# Patient Record
Sex: Female | Born: 1945 | Race: White | Hispanic: Yes | Marital: Married | Smoking: Never smoker
Health system: Southern US, Community
[De-identification: ages and names within clinical notes are randomized; demographics above are authoritative.]

## PROBLEM LIST (undated history)

## (undated) DIAGNOSIS — H269 Unspecified cataract: Secondary | ICD-10-CM

## (undated) DIAGNOSIS — A0100 Typhoid fever, unspecified: Secondary | ICD-10-CM

## (undated) HISTORY — DX: Unspecified cataract: H26.9

## (undated) HISTORY — DX: Typhoid fever, unspecified: A01.00

---

## 1999-06-09 ENCOUNTER — Other Ambulatory Visit: Admission: RE | Admit: 1999-06-09 | Discharge: 1999-06-09 | Payer: Self-pay | Admitting: Gynecology

## 2000-07-15 ENCOUNTER — Other Ambulatory Visit: Admission: RE | Admit: 2000-07-15 | Discharge: 2000-07-15 | Payer: Self-pay | Admitting: Gynecology

## 2003-05-29 ENCOUNTER — Encounter (INDEPENDENT_AMBULATORY_CARE_PROVIDER_SITE_OTHER): Payer: Self-pay | Admitting: *Deleted

## 2003-06-25 ENCOUNTER — Other Ambulatory Visit: Admission: RE | Admit: 2003-06-25 | Discharge: 2003-06-25 | Payer: Self-pay | Admitting: Gynecology

## 2003-07-31 ENCOUNTER — Encounter: Admission: RE | Admit: 2003-07-31 | Discharge: 2003-07-31 | Payer: Self-pay | Admitting: Family Medicine

## 2006-08-26 ENCOUNTER — Encounter (INDEPENDENT_AMBULATORY_CARE_PROVIDER_SITE_OTHER): Payer: Self-pay | Admitting: *Deleted

## 2008-06-19 ENCOUNTER — Ambulatory Visit: Payer: Self-pay | Admitting: Unknown Physician Specialty

## 2008-08-21 ENCOUNTER — Ambulatory Visit: Payer: Self-pay

## 2008-09-23 ENCOUNTER — Ambulatory Visit: Payer: Self-pay

## 2009-12-06 IMAGING — US US PELV - US TRANSVAGINAL
1 series · 17 of 25 positions shown · non-contrast
Comparison: none

REASON FOR EXAM: pelvic pain  PT needs spanish interpreter
COMMENTS:

[Series 1: us pelv - us transvaginal · 17 of 31 slices shown]
[im 1/31]
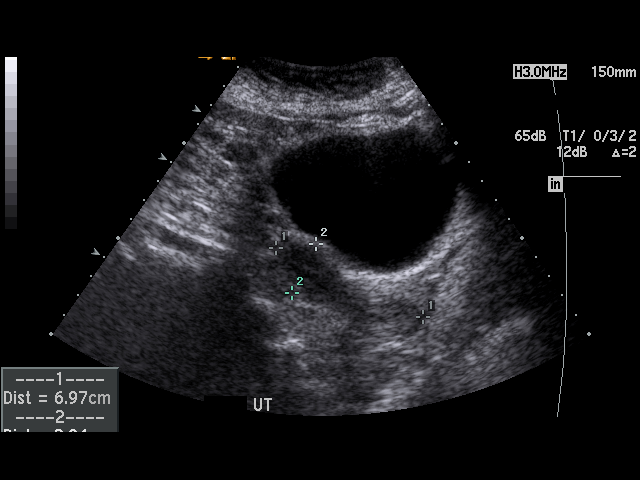
[im 3/31]
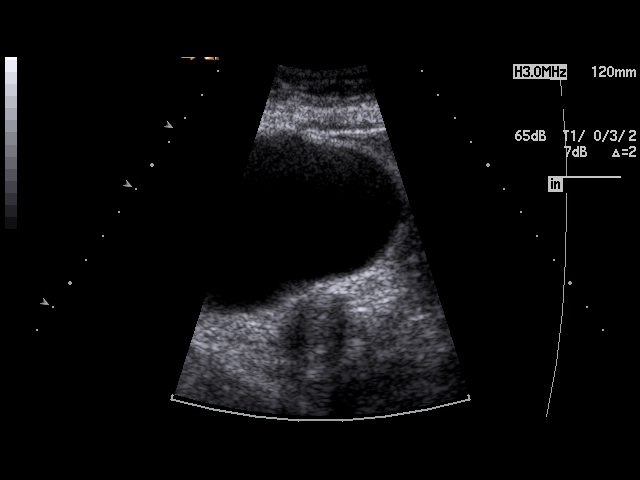
[im 4/31]
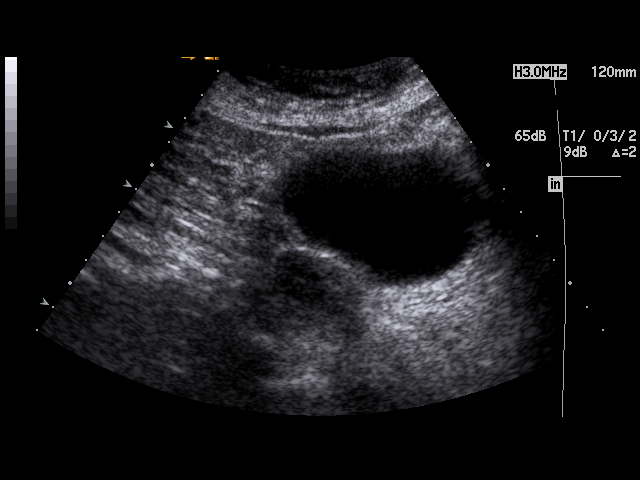
[im 7/31]
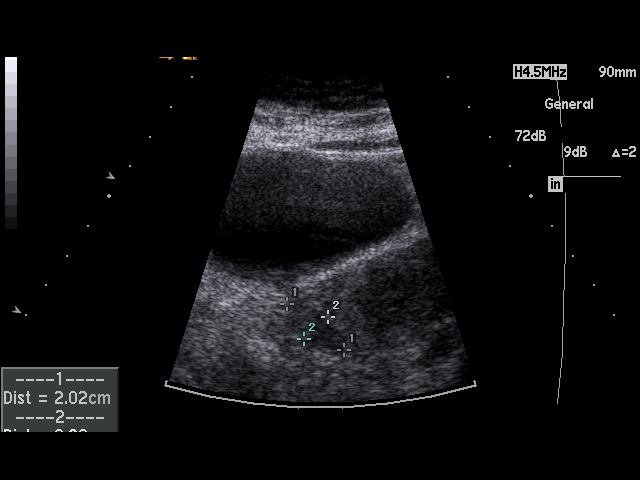
[im 8/31]
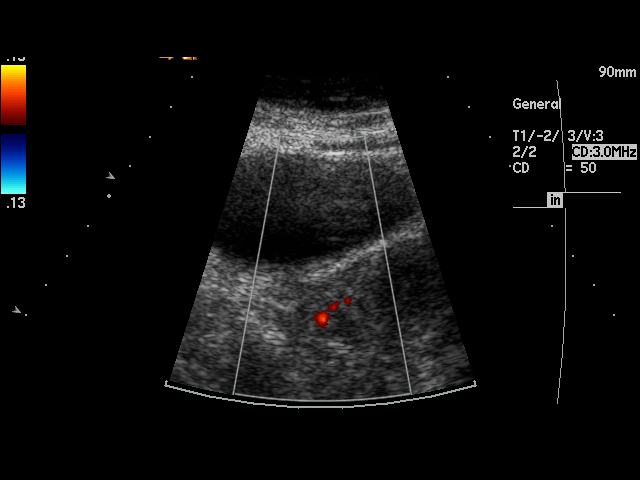
[im 11/31]
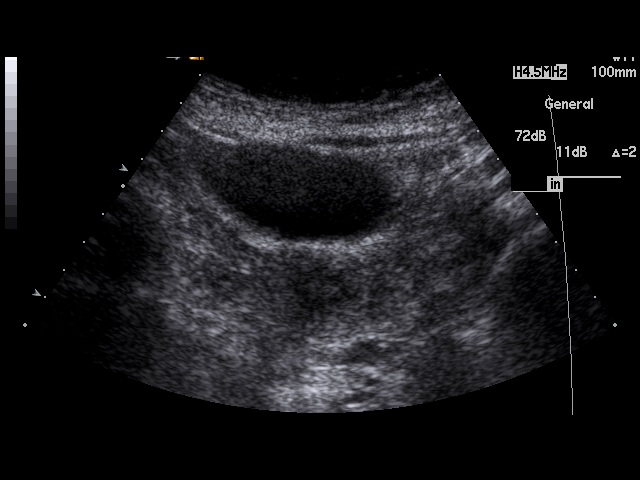
[im 12/31]
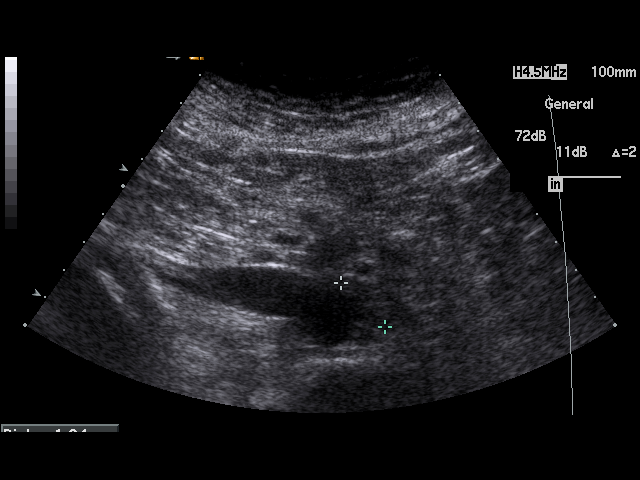
[im 14/31]
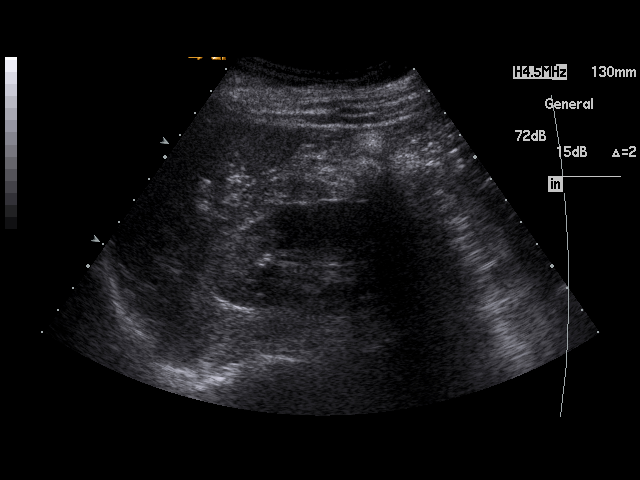
[im 16/31]
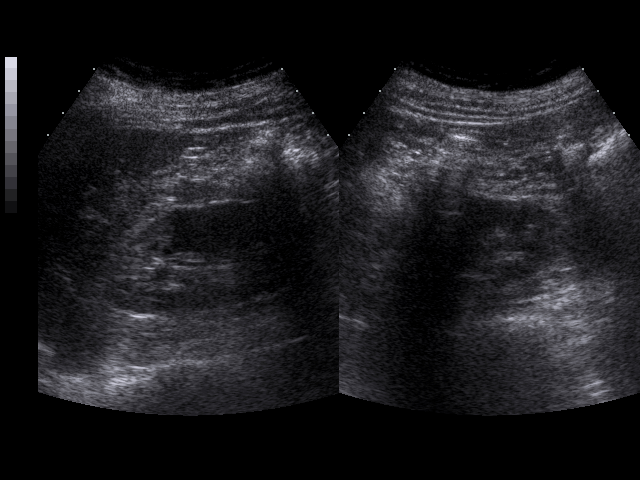
[im 17/31]
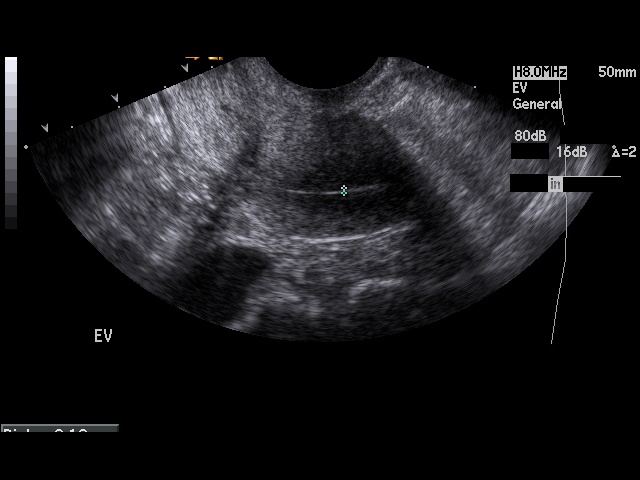
[im 19/31]
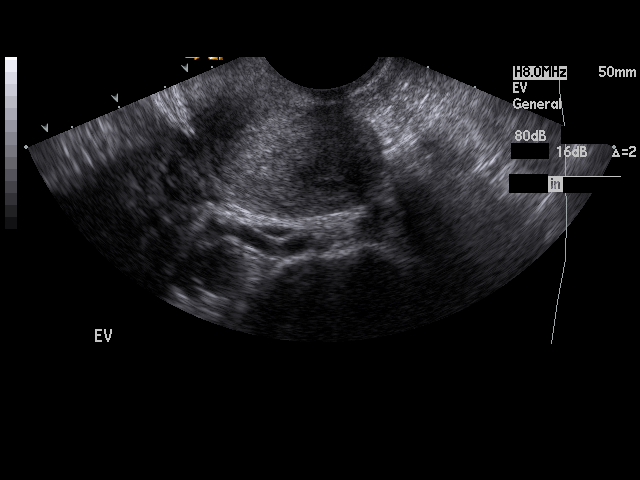
[im 21/31]
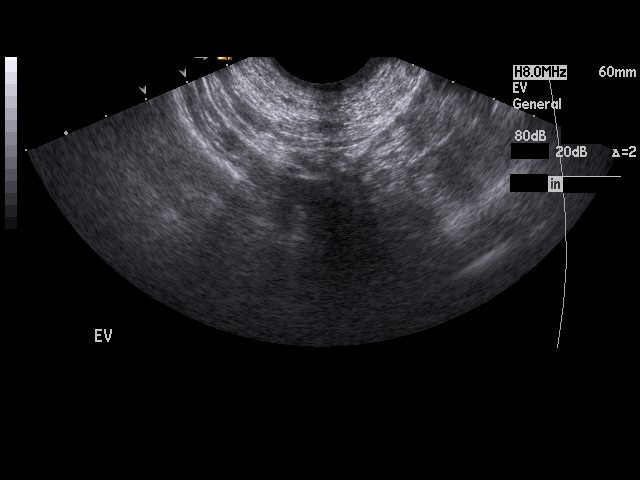
[im 23/31]
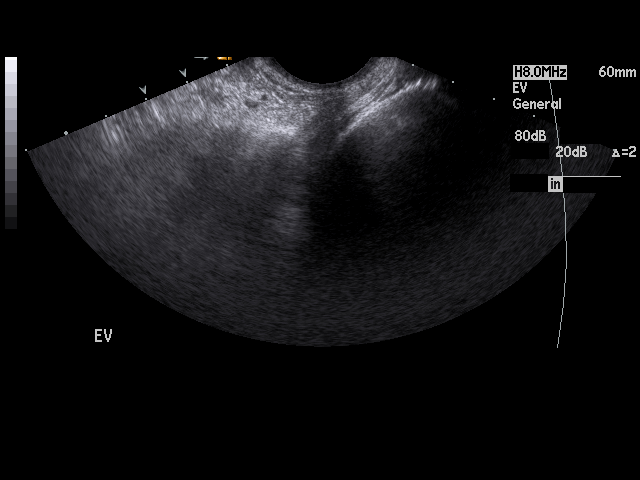
[im 24/31]
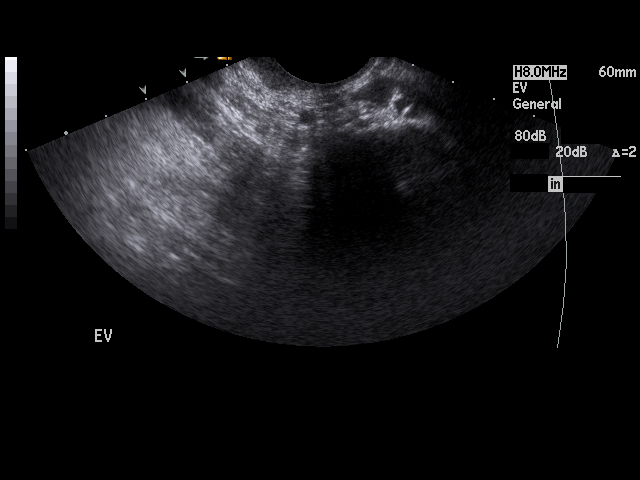
[im 27/31]
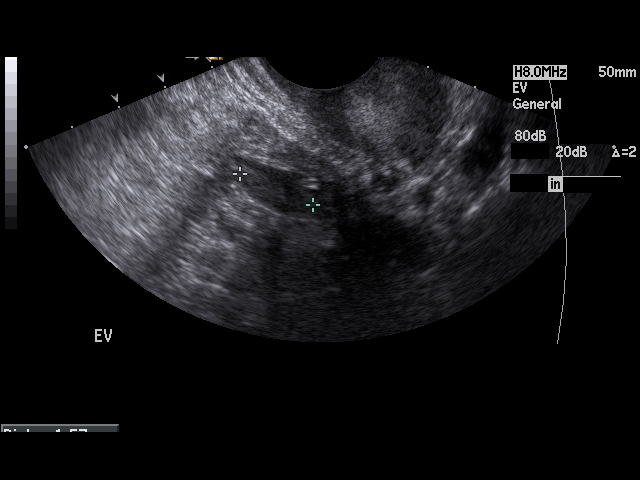
[im 28/31]
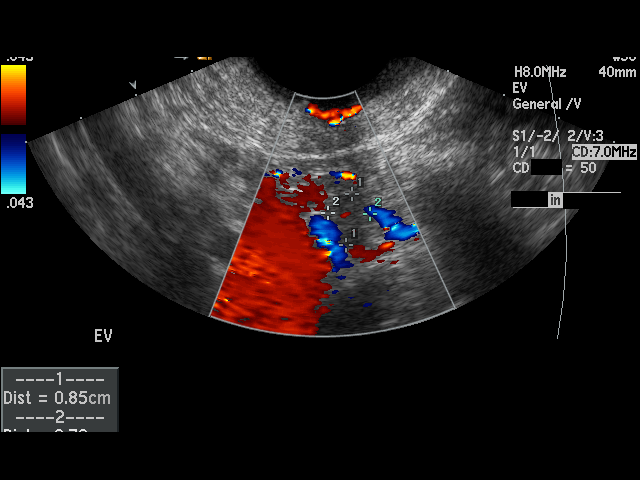
[im 31/31]
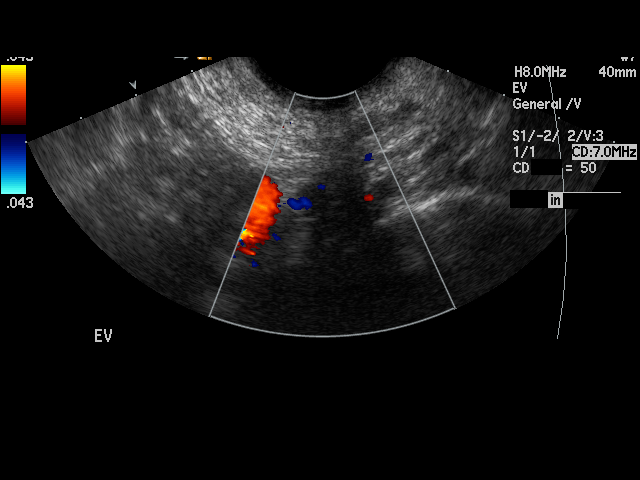

[17 of 25 positions shown; findings below may reference images not displayed]

PROCEDURE:     US  - US PELVIS MASS EXAM W/TRANSVAGI  - June 19, 2008  [DATE]

RESULT:     Transabdominal and endovaginal ultrasound was performed. The
uterus measures 6.97 cm x 2.34 cm x 3.72 cm. No uterine mass lesions are
seen. The endometrium measures 1 mm in thickness. The RIGHT and LEFT ovaries
are visualized. The RIGHT ovary measures 1.21 cm at maximum diameter and the
LEFT ovary measures 1.57 cm at maximum diameter. No abnormal adnexal masses
are seen. There is no free fluid observed in the pelvis. The kidneys show no
hydronephrosis. The visualized portion of the urinary bladder is normal in
appearance.
IMPRESSION: 1.     No significant abnormalities are noted.

## 2010-09-17 ENCOUNTER — Other Ambulatory Visit: Payer: Self-pay | Admitting: Geriatric Medicine

## 2010-09-17 DIAGNOSIS — Z1231 Encounter for screening mammogram for malignant neoplasm of breast: Secondary | ICD-10-CM

## 2010-09-28 ENCOUNTER — Other Ambulatory Visit: Payer: Self-pay | Admitting: Geriatric Medicine

## 2010-09-28 DIAGNOSIS — N898 Other specified noninflammatory disorders of vagina: Secondary | ICD-10-CM

## 2010-10-02 ENCOUNTER — Ambulatory Visit
Admission: RE | Admit: 2010-10-02 | Discharge: 2010-10-02 | Disposition: A | Discharge status: Home / Self Care | Payer: Medicare Other | Source: Ambulatory Visit | Attending: Geriatric Medicine | Admitting: Geriatric Medicine

## 2010-10-02 DIAGNOSIS — N898 Other specified noninflammatory disorders of vagina: Secondary | ICD-10-CM

## 2010-10-08 ENCOUNTER — Ambulatory Visit: Payer: Self-pay

## 2010-10-09 ENCOUNTER — Ambulatory Visit
Admission: RE | Admit: 2010-10-09 | Discharge: 2010-10-09 | Disposition: A | Discharge status: Home / Self Care | Payer: Medicare Other | Source: Ambulatory Visit | Attending: Geriatric Medicine | Admitting: Geriatric Medicine

## 2010-10-09 DIAGNOSIS — Z1231 Encounter for screening mammogram for malignant neoplasm of breast: Secondary | ICD-10-CM

## 2017-10-20 ENCOUNTER — Emergency Department (HOSPITAL_COMMUNITY)
Admission: EM | Admit: 2017-10-20 | Discharge: 2017-10-21 | Disposition: A | Discharge status: Home / Self Care | Payer: Medicare Other | Attending: Emergency Medicine | Admitting: Emergency Medicine

## 2017-10-20 ENCOUNTER — Emergency Department (HOSPITAL_COMMUNITY): Payer: Medicare Other

## 2017-10-20 ENCOUNTER — Encounter (HOSPITAL_COMMUNITY): Payer: Self-pay | Admitting: Emergency Medicine

## 2017-10-20 ENCOUNTER — Other Ambulatory Visit: Payer: Self-pay

## 2017-10-20 DIAGNOSIS — R42 Dizziness and giddiness: Secondary | ICD-10-CM | POA: Diagnosis present

## 2017-10-20 DIAGNOSIS — R0981 Nasal congestion: Secondary | ICD-10-CM | POA: Insufficient documentation

## 2017-10-20 DIAGNOSIS — R609 Edema, unspecified: Secondary | ICD-10-CM | POA: Diagnosis not present

## 2017-10-20 LAB — BASIC METABOLIC PANEL
ANION GAP: 9 (ref 5–15)
BUN: 12 mg/dL (ref 6–20)
CALCIUM: 9.7 mg/dL (ref 8.9–10.3)
CO2: 23 mmol/L (ref 22–32)
Chloride: 106 mmol/L (ref 101–111)
Creatinine, Ser: 0.87 mg/dL (ref 0.44–1.00)
GFR calc Af Amer: >60 mL/min (ref >60)
GLUCOSE: 122 mg/dL — AB (ref 65–99)
POTASSIUM: 3.9 mmol/L (ref 3.5–5.1)
SODIUM: 138 mmol/L (ref 135–145)

## 2017-10-20 LAB — CBC
HEMATOCRIT: 35.1 % — AB (ref 36.0–46.0)
HEMOGLOBIN: 12.6 g/dL (ref 12.0–15.0)
MCH: 28.5 pg (ref 26.0–34.0)
MCHC: 35.9 g/dL (ref 30.0–36.0)
MCV: 79.4 fL (ref 78.0–100.0)
Platelets: 220 10*3/uL (ref 150–400)
RBC: 4.42 MIL/uL (ref 3.87–5.11)
RDW: 13.8 % (ref 11.5–15.5)
WBC: 6.1 10*3/uL (ref 4.0–10.5)

## 2017-10-20 LAB — URINALYSIS, ROUTINE W REFLEX MICROSCOPIC
BILIRUBIN URINE: NEGATIVE
Glucose, UA: NEGATIVE mg/dL
Hgb urine dipstick: NEGATIVE
KETONES UR: NEGATIVE mg/dL
LEUKOCYTES UA: NEGATIVE
NITRITE: NEGATIVE
Protein, ur: NEGATIVE mg/dL
SPECIFIC GRAVITY, URINE: 1.004 — AB (ref 1.005–1.030)
pH: 6 (ref 5.0–8.0)

## 2017-10-20 LAB — I-STAT TROPONIN, ED: TROPONIN I, POC: 0 ng/mL (ref 0.00–0.08)

## 2017-10-20 LAB — BRAIN NATRIURETIC PEPTIDE: B Natriuretic Peptide: 37.4 pg/mL (ref 0.0–100.0)

## 2017-10-20 NOTE — ED Triage Notes (Signed)
Pt presents with multiple complaints. Pt reports feeling "off balance" for several weeks, states it comes and goes. Pt also reports SOB but states she doesn't know if it is related to congestion or not. Also reports bilateral leg swelling, 1+ pitting edema noted.

## 2017-10-21 MED ORDER — MOMETASONE FUROATE 50 MCG/ACT NA SUSP: 2.0000 | Freq: Every day | NASAL | 12 refills | Status: AC

## 2017-10-21 MED ORDER — MISC. DEVICES KIT: PACK | 0 refills | Status: AC

## 2017-10-21 MED ORDER — LORATADINE 10 MG PO TABS: 10.0000 mg | ORAL_TABLET | Freq: Every day | ORAL | 0 refills | Status: DC

## 2017-10-21 NOTE — ED Provider Notes (Signed)
[facility] EMERGENCY DEPARTMENT Provider Note   CSN: [id number] Arrival date & time: 10/20/17  1943     History   Chief Complaint Chief Complaint  Patient presents with  . Dizziness  . Shortness of Breath    HPI Brenda Callahan is a 72 y.o. female.  Presents to the ER with main complaint of nasal congestion.  She reports that she has not been able to breathe out of the left side of her nose.  She has not had any sneezing or coughing.  She denies chest pain, shortness of breath.  She has not had fever.  Patient also reports that both of her legs are swollen.  This has been going on for months.  She reports that when she wakes in the morning they are completely normal, but after she stands or sits for a long period of time her feet and lower legs well.     History reviewed. No pertinent past medical history.  There are no active problems to display for this patient.   History reviewed. No pertinent surgical history.   OB History   None      Home Medications    Prior to Admission medications   Medication Sig Start Date End Date Taking? Authorizing Provider  loratadine (CLARITIN) 10 MG tablet Take 1 tablet (10 mg total) by mouth daily. One po daily x 5 days 10/21/17   Orpah Greek, MD  Misc. Devices KIT One pair of below the knee compression stockings 10/21/17   Harper Smoker, Gwenyth Allegra, MD  mometasone (NASONEX) 50 MCG/ACT nasal spray Place 2 sprays into the nose daily. 10/21/17   Orpah Greek, MD    Family History No family history on file.  Social History Social History   Tobacco Use  . Smoking status: Not on file  Substance Use Topics  . Alcohol use: Not on file  . Drug use: Not on file     Allergies   Patient has no known allergies.   Review of Systems Review of Systems  HENT: Positive for congestion.   Cardiovascular: Positive for leg swelling.  All other systems reviewed and are negative.    Physical  Exam Updated Vital Signs BP (!) 165/78   Pulse (!) 104   Temp 97.9 F (36.6 C) (Oral)   Resp (!) 29   Ht '5\' 2"'$  (1.575 m)   Wt 81.6 kg (180 lb)   SpO2 99%   BMI 32.92 kg/m   Physical Exam  Constitutional: She is oriented to person, place, and time. She appears well-developed and well-nourished. No distress.  HENT:  Head: Normocephalic and atraumatic.  Right Ear: Hearing normal.  Left Ear: Hearing normal.  Nose: Nose normal.  Mouth/Throat: Oropharynx is clear and moist and mucous membranes are normal.  Eyes: Pupils are equal, round, and reactive to light. Conjunctivae and EOM are normal.  Neck: Normal range of motion. Neck supple.  Cardiovascular: Regular rhythm, S1 normal and S2 normal. Exam reveals no gallop and no friction rub.  No murmur heard. Pulmonary/Chest: Effort normal and breath sounds normal. No respiratory distress. She exhibits no tenderness.  Abdominal: Soft. Normal appearance and bowel sounds are normal. There is no hepatosplenomegaly. There is no tenderness. There is no rebound, no guarding, no tenderness at McBurney's point and negative Murphy's sign. No hernia.  Musculoskeletal: Normal range of motion.       Right lower leg: She exhibits edema (trace).  Neurological: She is alert and oriented to person,  place, and time. She has normal strength. No cranial nerve deficit or sensory deficit. Coordination normal. GCS eye subscore is 4. GCS verbal subscore is 5. GCS motor subscore is 6.  Skin: Skin is warm, dry and intact. No rash noted. No cyanosis.  Psychiatric: She has a normal mood and affect. Her speech is normal and behavior is normal. Thought content normal.  Nursing note and vitals reviewed.    ED Treatments / Results  Labs (all labs ordered are listed, but only abnormal results are displayed) Labs Reviewed  BASIC METABOLIC PANEL - Abnormal; Notable for the following components:      Result Value   Glucose, Bld 122 (*)    All other components within  normal limits  CBC - Abnormal; Notable for the following components:   HCT 35.1 (*)    All other components within normal limits  URINALYSIS, ROUTINE W REFLEX MICROSCOPIC - Abnormal; Notable for the following components:   Color, Urine STRAW (*)    Specific Gravity, Urine 1.004 (*)    All other components within normal limits  BRAIN NATRIURETIC PEPTIDE  I-STAT TROPONIN, ED    EKG EKG Interpretation  Date/Time:  Thursday October 20 2017 20:39:10 EDT Ventricular Rate:  74 PR Interval:  148 QRS Duration: 72 QT Interval:  366 QTC Calculation: 406 R Axis:   9 Text Interpretation:  Normal sinus rhythm ST & T wave abnormality, consider lateral ischemia Abnormal ECG No previous tracing Confirmed by Orpah Greek [phone]) on 10/21/2017 3:50:24 AM   Radiology Ct Head Wo Contrast  Result Date: 10/20/2017 CLINICAL DATA:  72 y/o F; patient reports feeling "off balance" for several weeks intermittently and bilateral leg swelling. EXAM: CT HEAD WITHOUT CONTRAST TECHNIQUE: Contiguous axial images were obtained from the base of the skull through the vertex without intravenous contrast. COMPARISON:  None. FINDINGS: Brain: No evidence of acute infarction, hemorrhage, hydrocephalus, extra-axial collection or mass lesion/mass effect. Few nonspecific foci of hypoattenuation in white matter compatible with mild chronic microvascular ischemic changes for age. Vascular: Calcific atherosclerosis of the carotid siphons. No hyperdense vessel identified. Skull: Normal. Negative for fracture or focal lesion. Sinuses/Orbits: No acute finding. Other: None. IMPRESSION: 1. No acute intracranial abnormality identified. 2. 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 Electronically Signed   By: Kristine Garbe M.D.   On: 10/20/2017 22:53    Procedures Procedures (including critical care time)  Medications Ordered in ED Medications - No data to display   Initial Impression / Assessment  and Plan / ED Course  I have reviewed the triage vital signs and the nursing notes.  Pertinent labs & imaging results that were available during my care of the patient were reviewed by me and considered in my medical decision making (see chart for details).     Patient presents to the emergency department for multiple problems, all of which have been ongoing for some time.  Her main complaint is nasal congestion, primarily on the left side of her nose.  Her examination is unremarkable.  She did have a CT of her head.  This was clear including no signs of sinusitis.  Somehow in triage it was documented that she has dizziness, but through an interpreter, she specifically denied dizziness.  She does not have headaches or vision change.  Her only complaint is the nasal congestion and also leg swelling.  The leg swelling has been ongoing for months and appears to be peripheral edema.  Her BNP is normal.  Cardiac and  lung examination are unremarkable.  Chest x-ray did not show evidence of fluid.  Swelling appears to be dependent in nature.  Will prescribe compression stockings.  I do not believe she requires Lasix or diuretics at this time.  Final Clinical Impressions(s) / ED Diagnoses   Final diagnoses:  Sinus congestion  Peripheral edema    ED Discharge Orders        Ordered    mometasone (NASONEX) 50 MCG/ACT nasal spray  Daily     10/21/17 0430    Misc. Devices KIT     10/21/17 0430    loratadine (CLARITIN) 10 MG tablet  Daily     10/21/17 0430       Orpah Greek, MD 10/21/17 0430

## 2018-06-29 IMAGING — CT CT HEAD W/O CM
3 series · 16 of 47 positions shown, 19 images · non-contrast
Comparison: None.

CLINICAL DATA: 71 y/o F; patient reports feeling "off balance" for
several weeks intermittently and bilateral leg swelling.

EXAM:
CT HEAD WITHOUT CONTRAST
TECHNIQUE: Contiguous axial images were obtained from the base of the skull
through the vertex without intravenous contrast.

[Series 3: head 5.0 h30s · axial · 0.39mm/px · z∈[-98,+32]mm · 10 of 32 slices shown, 13 images]
[im 3/32  brain]
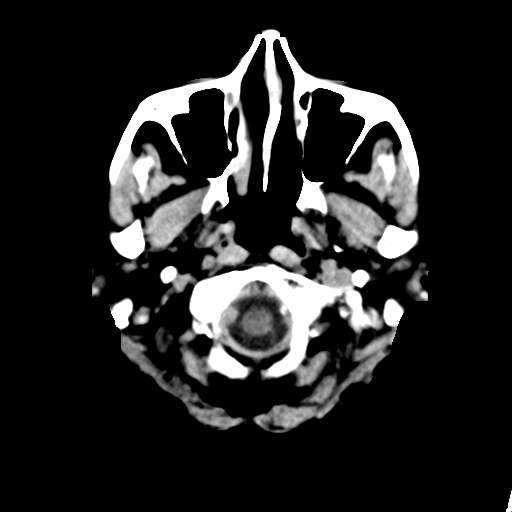
[im 3/32  bone]
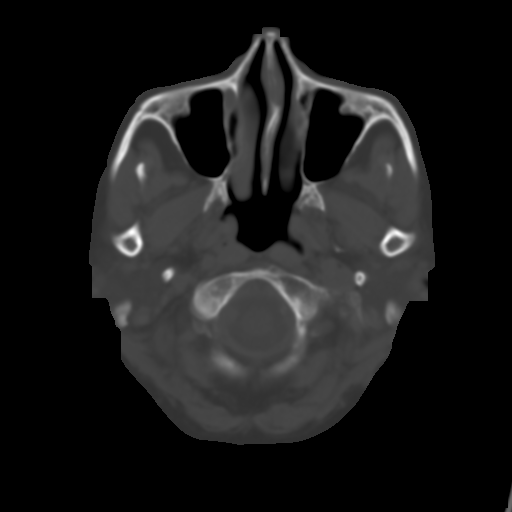
[im 6/32  brain]
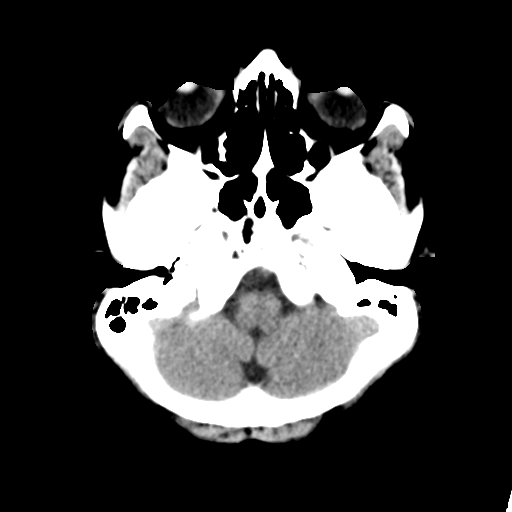
[im 9/32  brain]
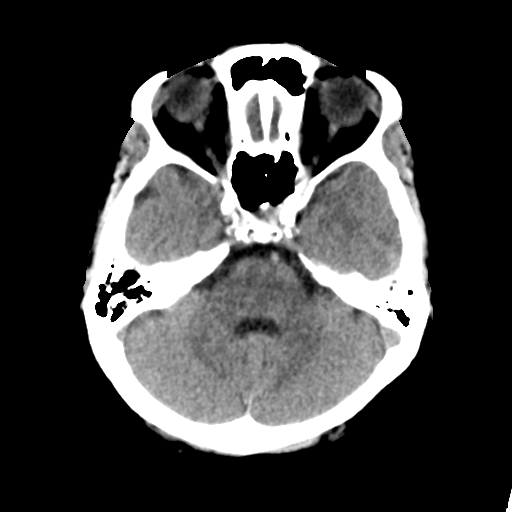
[im 11/32  brain]
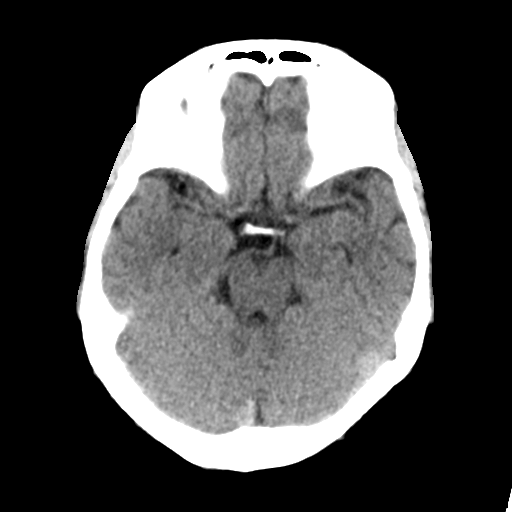
[im 14/32  brain]
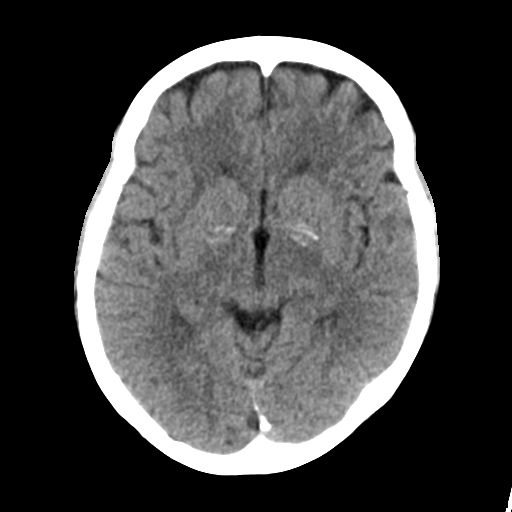
[im 14/32  bone]
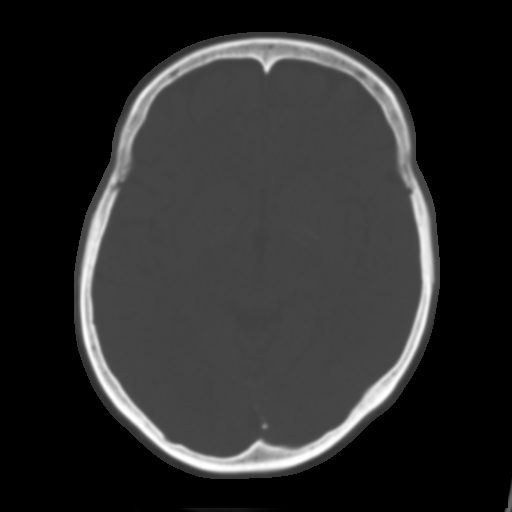
[im 18/32  brain]
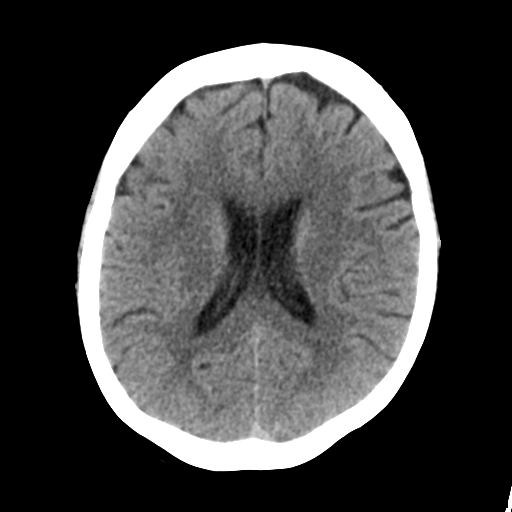
[im 21/32  brain]
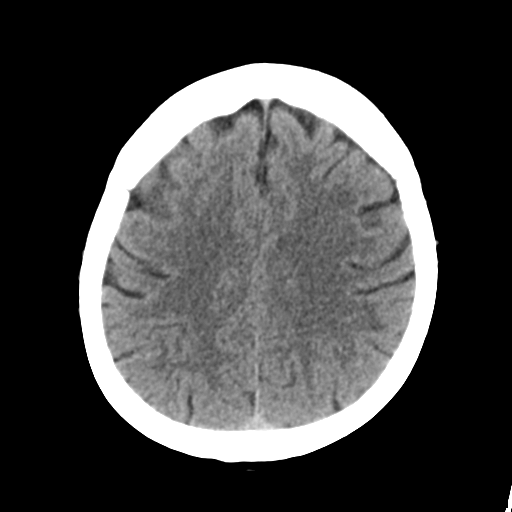
[im 24/32  brain]
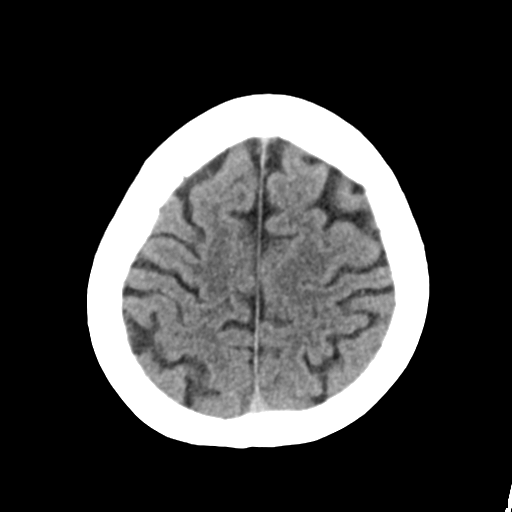
[im 26/32  brain]
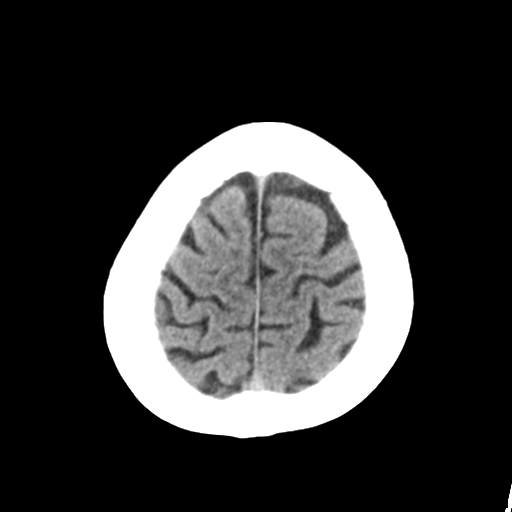
[im 26/32  bone]
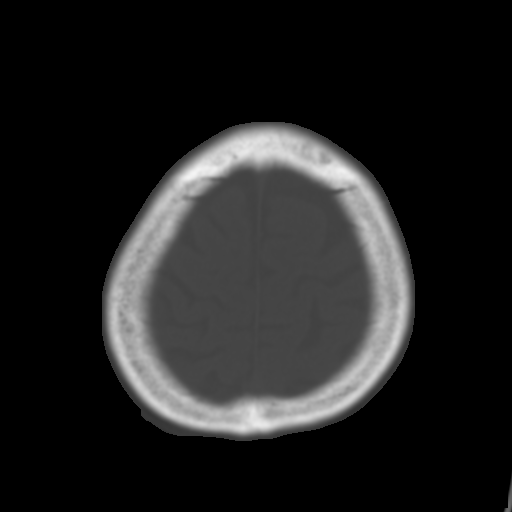
[im 29/32  brain]
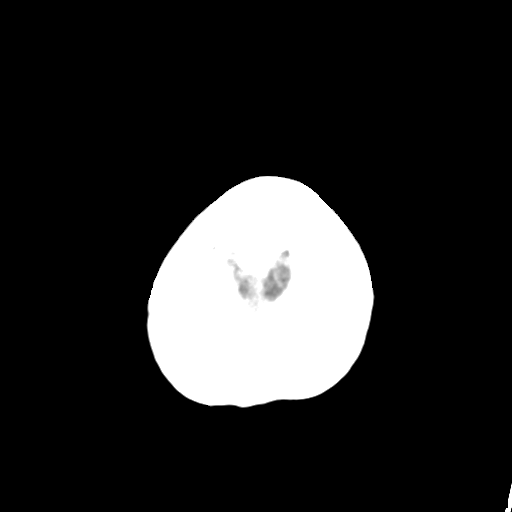

[Series 5: head 3.0 mpr cor · coronal · 0.31mm/px · 3 of 64 slices shown]
[im 22/64  brain]
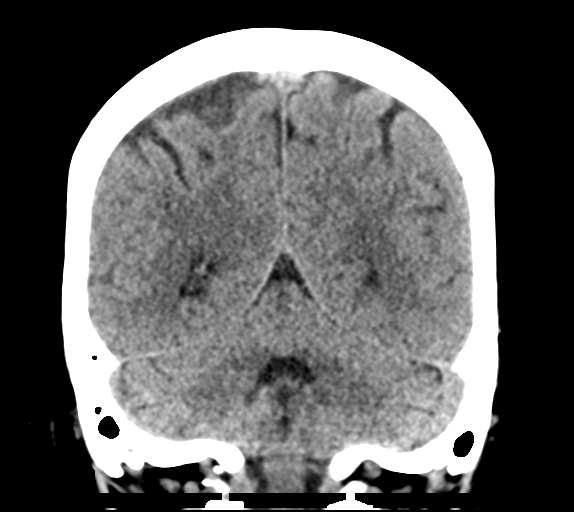
[im 29/64  brain]
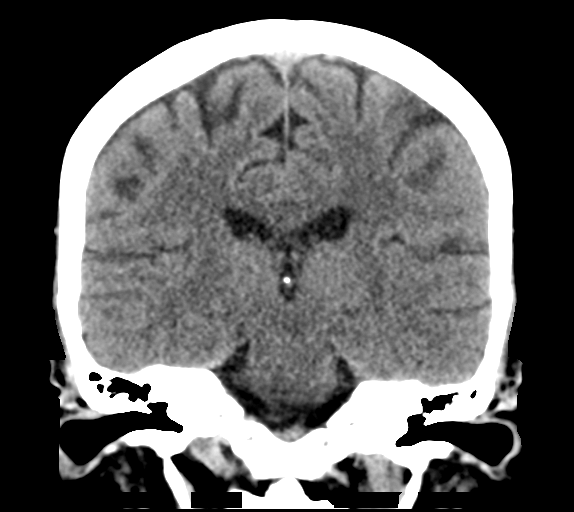
[im 36/64  brain]
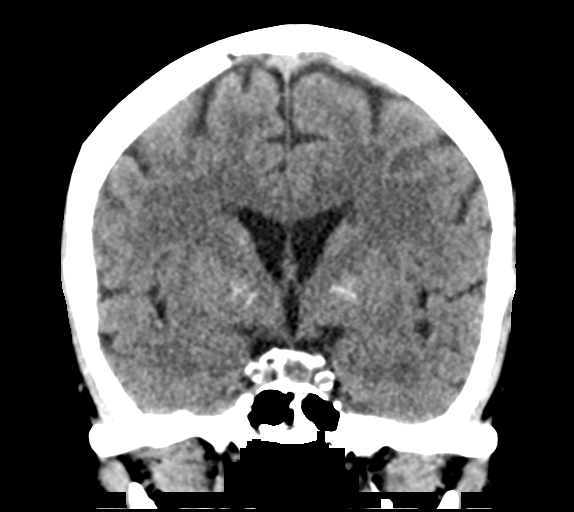

[Series 6: head 3.0 mpr sag · sagittal · 0.31mm/px · 3 of 58 slices shown]
[im 20/58  brain]
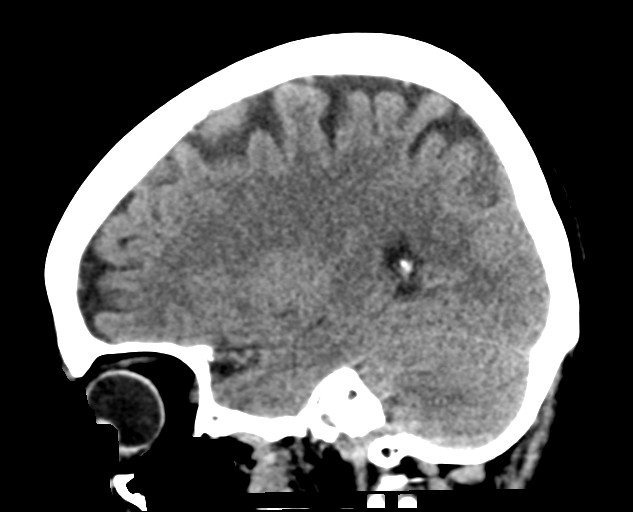
[im 29/58  brain]
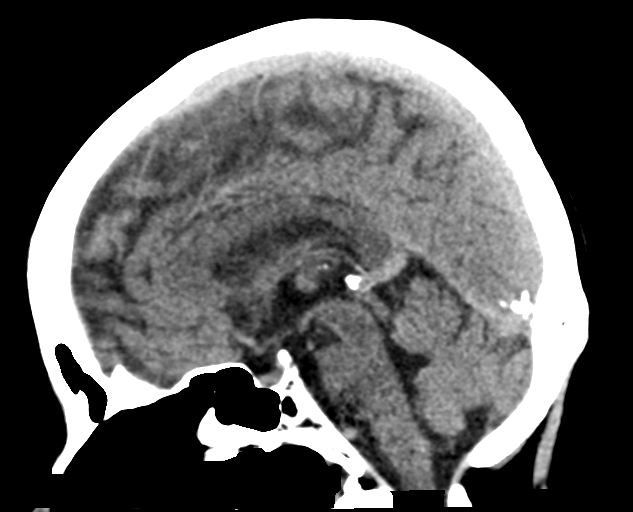
[im 39/58  brain]
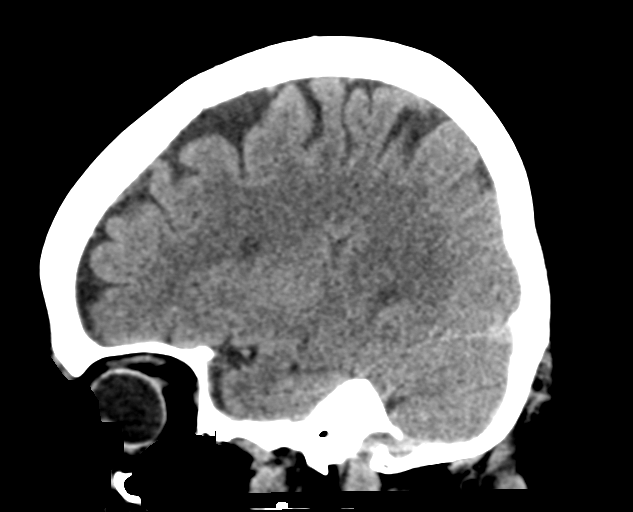

[16 of 47 positions shown; findings below may reference images not displayed]

FINDINGS: Brain: No evidence of acute infarction, hemorrhage, hydrocephalus,
extra-axial collection or mass lesion/mass effect. Few nonspecific
foci of hypoattenuation in white matter compatible with mild chronic
microvascular ischemic changes for age.

Vascular: Calcific atherosclerosis of the carotid siphons. No
hyperdense vessel identified.

Skull: Normal. Negative for fracture or focal lesion.

Sinuses/Orbits: No acute finding.

Other: None.
IMPRESSION: 1. No acute intracranial abnormality identified.
2. Mild chronic microvascular ischemic changes of the brain.

By: Ibrahima Schramm M.D.

## 2019-02-01 ENCOUNTER — Other Ambulatory Visit: Payer: Self-pay

## 2019-02-01 ENCOUNTER — Ambulatory Visit: Payer: Medicare HMO | Attending: Internal Medicine | Admitting: Internal Medicine

## 2019-02-01 ENCOUNTER — Encounter: Payer: Self-pay | Admitting: Internal Medicine

## 2019-02-01 VITALS — BP 146/50 | HR 65 | Temp 98.0°F | Resp 16 | Ht <= 58 in | Wt 134.8 lb

## 2019-02-01 DIAGNOSIS — I10 Essential (primary) hypertension: Secondary | ICD-10-CM

## 2019-02-01 DIAGNOSIS — M8580 Other specified disorders of bone density and structure, unspecified site: Secondary | ICD-10-CM | POA: Insufficient documentation

## 2019-02-01 DIAGNOSIS — E785 Hyperlipidemia, unspecified: Secondary | ICD-10-CM

## 2019-02-01 DIAGNOSIS — R739 Hyperglycemia, unspecified: Secondary | ICD-10-CM

## 2019-02-01 DIAGNOSIS — E039 Hypothyroidism, unspecified: Secondary | ICD-10-CM | POA: Insufficient documentation

## 2019-02-01 NOTE — Patient Instructions (Addendum)
Please sign a release for me to get your records from your previous doctor.    Please purchase Os-Cal plus vitamin D 600mg /200 IU over-the-counter and take 1 tablet twice a day.

## 2019-02-01 NOTE — Progress Notes (Signed)
Patient ID: Brenda Callahan, female    DOB: 1945-08-06  MRN: [id number]  CC: New Patient (Initial Visit), Hypertension, and Hypothyroidism   Subjective: Lynnex Fulp is a 73 y.o. female who presents for new pt visit and chronic ds management. Grecia Nanin from [facility] is with her and interprets.  Her concerns today include:   pt is from [place].  Lived in [place] x 30 yrs.  PCP was Dr. Rodena Piety who is relocating to [place].  This is the reason that patient presented today to establish care. Pt with hx of HTN, HL, osteopenia and hypothyroidism.    HTN:  Taking Norvasc, Vasotec and Toprol once a day.  Checks BP daily.  Gives range of 109-125/50s Took meds already for today Limits salt in foods No CP/SOB/HA +LE edema Seen in ER for LE edema about 1 yr ago.  Had study done.  Told likely due to bad veins.  Told to wear compression socks.  She did get them but not consistent in wearing  Hypothyroidism:  No prior surgery Takes Levoxyl daily.  Denies any problems with constipation or diarrhea.  No palpitations.  Osteopenia: Reports having had a bone density study about 3 years ago when she lived in [place] and told that she was close to having osteoporosis.  She is supposed to be on calcium and vitamin D but states she has had problems finding it over the past several weeks in the grocery stores.  HM:  Had nl c-scope 3 yrs ago. Last MMG was 03/2018.  Reports being up-to-date with pneumonia vaccines and tdap 2 yrs ago  Past medical, social, family history and surgical histories reviewed and updated.  Current Outpatient Medications on File Prior to Visit  Medication Sig Dispense Refill  . amLODipine (NORVASC) 10 MG tablet Take 10 mg by mouth daily.    Marland Kitchen aspirin EC 81 MG tablet Take 81 mg by mouth daily.    Marland Kitchen atorvastatin (LIPITOR) 20 MG tablet Take 20 mg by mouth daily.    . enalapril (VASOTEC) 20 MG tablet Take 20 mg by mouth daily.    Marland Kitchen levothyroxine (SYNTHROID) 75 MCG tablet Take 75  mcg by mouth daily before breakfast.    . metoprolol succinate (TOPROL-XL) 50 MG 24 hr tablet Take 50 mg by mouth daily. Take with or immediately following a meal.    . mometasone (NASONEX) 50 MCG/ACT nasal spray Place 2 sprays into the nose daily. 17 g 12  . Misc. Devices KIT One pair of below the knee compression stockings 1 each 0   No current facility-administered medications on file prior to visit.     No Known Allergies  Social History   Socioeconomic History  . Marital status: Married    Spouse name: Not on file  . Number of children: 2  . Years of education: Not on file  . Highest education level: 3rd grade  Occupational History  . Occupation: retired  Scientific laboratory technician  . Financial resource strain: Not on file  . Food insecurity    Worry: Not on file    Inability: Not on file  . Transportation needs    Medical: Not on file    Non-medical: Not on file  Tobacco Use  . Smoking status: Never Smoker  . Smokeless tobacco: Never Used  Substance and Sexual Activity  . Alcohol use: Not Currently  . Drug use: Never  . Sexual activity: Not on file  Lifestyle  . Physical activity    Days  per week: Not on file    Minutes per session: Not on file  . Stress: Not on file  Relationships  . Social Herbalist on phone: Not on file    Gets together: Not on file    Attends religious service: Not on file    Active member of club or organization: Not on file    Attends meetings of clubs or organizations: Not on file    Relationship status: Not on file  . Intimate partner violence    Fear of current or ex partner: Not on file    Emotionally abused: Not on file    Physically abused: Not on file    Forced sexual activity: Not on file  Other Topics Concern  . Not on file  Social History Narrative  . Not on file    Family History  Problem Relation Age of Onset  . Hypertension Sister     Past Surgical History:  Procedure Laterality Date  . CESAREAN SECTION       ROS: Review of Systems  Constitutional:       Patient reports good appetite.  She is eating and sleeping well.  She walks daily for exercise.  HENT: Negative for hearing loss.   Eyes: Negative for visual disturbance.       Patient wears glasses.  Last eye exam was 2 years ago.  Respiratory: Negative for cough and shortness of breath.   Cardiovascular: Negative for chest pain and palpitations.  Gastrointestinal: Negative for abdominal pain and blood in stool.  Neurological: Negative for dizziness and headaches.   PHYSICAL EXAM: BP (!) 146/50   Pulse 65   Temp 98 F (36.7 C) (Oral)   Resp 16   Ht _0  (1.473 m)   Wt 134 lb 12.8 oz (61.1 kg)   SpO2 97%   BMI 28.17 kg/m   Physical Exam  General appearance - alert, well appearing, pleasant elderly female and in no distress Mental status - normal mood, behavior, speech, dress, motor activity, and thought processes Eyes - pupils equal and reactive, extraocular eye movements intact Nose - normal and patent, no erythema, discharge or polyps Mouth - mucous membranes moist, pharynx normal without lesions Neck - supple, no significant adenopathy Chest - clear to auscultation, no wheezes, rales or rhonchi, symmetric air entry Heart - normal rate, regular rhythm, normal S1, S2, no murmurs, rubs, clicks or gallops Abdomen - soft, nontender, nondistended, no masses or organomegaly Extremities -trace lower extremity edema.  Good dorsalis pedis and posterior tibialis pulses. Skin: She has a birthmark of a patch of hair on the right lower extremity lateral aspect of the calf  CMP Latest Ref Rng & Units 10/20/2017  Glucose 65 - 99 mg/dL 122(H)  BUN 6 - 20 mg/dL 12  Creatinine 0.44 - 1.00 mg/dL 0.87  Sodium 135 - 145 mmol/L 138  Potassium 3.5 - 5.1 mmol/L 3.9  Chloride 101 - 111 mmol/L 106  CO2 22 - 32 mmol/L 23  Calcium 8.9 - 10.3 mg/dL 9.7   Lipid Panel  No results found for: CHOL, TRIG, HDL, CHOLHDL, VLDL, LDLCALC, LDLDIRECT  CBC     Component Value Date/Time   WBC 6.1 10/20/2017 2105   RBC 4.42 10/20/2017 2105   HGB 12.6 10/20/2017 2105   HCT 35.1 (L) 10/20/2017 2105   PLT 220 10/20/2017 2105   MCV 79.4 10/20/2017 2105   MCH 28.5 10/20/2017 2105   MCHC 35.9 10/20/2017 2105   RDW 13.8  10/20/2017 2105    ASSESSMENT AND PLAN: 1. Essential hypertension Not at goal today.  However reported blood pressure readings at home are at goal.  She will continue low-salt diet.  I have not made any changes to the dose of her current medications that include Vasotec, metoprolol and amlodipine - CBC - Comprehensive metabolic panel  2. Hyperlipidemia, unspecified hyperlipidemia type Continue atorvastatin. - Lipid panel  3. Acquired hypothyroidism Continue levothyroxine. - TSH  4. Osteopenia, unspecified location I have given her a handwritten information for the dose of Os-Cal plus vitamin D being 600 mg / 200 IU to purchase over-the-counter and take 1 tablet twice a day. Encouraged her to continue weightbearing exercise - DG Bone Density; Future - VITAMIN D 25 Hydroxy (Vit-D Deficiency, Fractures)  Patient advised to sign a release for me to get her medical records from previous PCP so that I can update her health maintenance.  Patient was given the opportunity to ask questions.  Patient verbalized understanding of the plan and was able to repeat key elements of the plan.   Orders Placed This Encounter  Procedures  . DG Bone Density  . CBC  . Comprehensive metabolic panel  . Lipid panel  . TSH  . VITAMIN D 25 Hydroxy (Vit-D Deficiency, Fractures)     Requested Prescriptions    No prescriptions requested or ordered in this encounter    Return in about 4 months (around 06/03/2019).  Karle Plumber, MD, FACP

## 2019-02-02 LAB — COMPREHENSIVE METABOLIC PANEL
ALT: 15 IU/L (ref 0–32)
AST: 30 IU/L (ref 0–40)
Albumin/Globulin Ratio: 1.4 (ref 1.2–2.2)
Albumin: 4.6 g/dL (ref 3.7–4.7)
Alkaline Phosphatase: 99 IU/L (ref 39–117)
BUN/Creatinine Ratio: 15 (ref 12–28)
BUN: 18 mg/dL (ref 8–27)
Bilirubin Total: 0.4 mg/dL (ref 0.0–1.2)
CO2: 21 mmol/L (ref 20–29)
Calcium: 9.7 mg/dL (ref 8.7–10.3)
Chloride: 103 mmol/L (ref 96–106)
Creatinine, Ser: 1.22 mg/dL — ABNORMAL HIGH (ref 0.57–1.00)
GFR calc Af Amer: 50 mL/min/{1.73_m2} — ABNORMAL LOW (ref >59)
GFR calc non Af Amer: 43 mL/min/{1.73_m2} — ABNORMAL LOW (ref >59)
Globulin, Total: 3.3 g/dL (ref 1.5–4.5)
Glucose: 117 mg/dL — ABNORMAL HIGH (ref 65–99)
Potassium: 4.6 mmol/L (ref 3.5–5.2)
Sodium: 139 mmol/L (ref 134–144)
Total Protein: 7.9 g/dL (ref 6.0–8.5)

## 2019-02-02 LAB — CBC
Hematocrit: 37.9 % (ref 34.0–46.6)
Hemoglobin: 13.1 g/dL (ref 11.1–15.9)
MCH: 28.9 pg (ref 26.6–33.0)
MCHC: 34.6 g/dL (ref 31.5–35.7)
MCV: 84 fL (ref 79–97)
Platelets: 241 10*3/uL (ref 150–450)
RBC: 4.54 x10E6/uL (ref 3.77–5.28)
RDW: 14.4 % (ref 11.7–15.4)
WBC: 6.1 10*3/uL (ref 3.4–10.8)

## 2019-02-02 LAB — LIPID PANEL
Chol/HDL Ratio: 2.5 ratio (ref 0.0–4.4)
Cholesterol, Total: 169 mg/dL (ref 100–199)
HDL: 68 mg/dL (ref >39)
LDL Calculated: 64 mg/dL (ref 0–99)
Triglycerides: 187 mg/dL — ABNORMAL HIGH (ref 0–149)
VLDL Cholesterol Cal: 37 mg/dL (ref 5–40)

## 2019-02-02 LAB — VITAMIN D 25 HYDROXY (VIT D DEFICIENCY, FRACTURES): Vit D, 25-Hydroxy: 31.9 ng/mL (ref 30.0–100.0)

## 2019-02-02 LAB — TSH: TSH: 3.09 u[IU]/mL (ref 0.450–4.500)

## 2019-02-02 NOTE — Addendum Note (Signed)
Addended by: Karle Plumber B on: 02/02/2019 08:09 AM   Modules accepted: Orders

## 2019-02-05 ENCOUNTER — Telehealth: Payer: Self-pay

## 2019-02-05 NOTE — Telephone Encounter (Signed)
[employer] [place]  Id# [id number]  contacted pt to go over lab results pt is aware and doesn't have any questions or concerns  

## 2019-02-10 LAB — SPECIMEN STATUS REPORT

## 2019-02-10 LAB — HGB A1C W/O EAG: Hgb A1c MFr Bld: 5.5 % (ref 4.8–5.6)

## 2019-03-28 ENCOUNTER — Ambulatory Visit: Payer: Medicare HMO | Attending: Family Medicine | Admitting: Pharmacist

## 2019-03-28 ENCOUNTER — Other Ambulatory Visit: Payer: Self-pay

## 2019-03-28 DIAGNOSIS — Z23 Encounter for immunization: Secondary | ICD-10-CM | POA: Diagnosis not present

## 2019-03-28 NOTE — Progress Notes (Signed)
Patient presents for vaccination against influenza per orders of Dr. Johnson. Consent given. Counseling provided. No contraindications exists. Vaccine administered without incident.   

## 2019-04-16 ENCOUNTER — Encounter: Payer: Self-pay | Admitting: Nurse Practitioner

## 2019-04-16 ENCOUNTER — Other Ambulatory Visit: Payer: Self-pay

## 2019-04-16 ENCOUNTER — Ambulatory Visit: Payer: Medicare HMO | Attending: Nurse Practitioner | Admitting: Nurse Practitioner

## 2019-04-16 DIAGNOSIS — J069 Acute upper respiratory infection, unspecified: Secondary | ICD-10-CM

## 2019-04-16 DIAGNOSIS — T50B95A Adverse effect of other viral vaccines, initial encounter: Secondary | ICD-10-CM | POA: Diagnosis not present

## 2019-04-16 DIAGNOSIS — R05 Cough: Secondary | ICD-10-CM

## 2019-04-16 DIAGNOSIS — J101 Influenza due to other identified influenza virus with other respiratory manifestations: Secondary | ICD-10-CM

## 2019-04-16 DIAGNOSIS — J111 Influenza due to unidentified influenza virus with other respiratory manifestations: Secondary | ICD-10-CM

## 2019-04-16 NOTE — Progress Notes (Signed)
Virtual Visit via Telephone Note Due to national recommendations of social distancing due to [place] 19, telehealth visit is felt to be most appropriate for this patient at this time.  I discussed the limitations, risks, security and privacy concerns of performing an evaluation and management service by telephone and the availability of in person appointments. I also discussed with the patient that there may be a patient responsible charge related to this service. The patient expressed understanding and agreed to proceed.    I connected with Brenda Callahan on 04/16/19  at  10:10 AM EDT  EDT by telephone and verified that I am speaking with the correct person using two identifiers.   Consent I discussed the limitations, risks, security and privacy concerns of performing an evaluation and management service by telephone and the availability of in person appointments. I also discussed with the patient that there may be a patient responsible charge related to this service. The patient expressed understanding and agreed to proceed.   Location of Patient: [place]   Location of Provider: [place] [facility] [place] participating in Telemedicine visit: Geryl Rankins FNP-BC [place] interpreter ID number [phone]   History of Present Illness: Telemedicine visit for: Flu vaccine follow-up  Previous symptoms of cough, runny nose, headache, myalgia completely resolved over a week ago. However patient is questioning why she developed mild flulike symptoms after receiving her recent flu vaccine on 03/28/2019.  She states she has never been sick in the past after receiving the flu vaccine. I instructed her that this can be a normal immune response after receiving the vaccine and symptoms usually subside after several days. She denies any site reactions. All questions answered and patient verbalized understanding.   Past Medical History:   Diagnosis Date  . Typhoid     Past Surgical History:  Procedure Laterality Date  . CESAREAN SECTION      Family History  Problem Relation Age of Onset  . Hypertension Sister     Social History   Socioeconomic History  . Marital status: Married    Spouse name: Not on file  . Number of children: 2  . Years of education: Not on file  . Highest education level: 3rd grade  Occupational History  . Occupation: retired  Scientific laboratory technician  . Financial resource strain: Not on file  . Food insecurity    Worry: Not on file    Inability: Not on file  . Transportation needs    Medical: Not on file    Non-medical: Not on file  Tobacco Use  . Smoking status: Never Smoker  . Smokeless tobacco: Never Used  Substance and Sexual Activity  . Alcohol use: Not Currently  . Drug use: Never  . Sexual activity: Not on file  Lifestyle  . Physical activity    Days per week: Not on file    Minutes per session: Not on file  . Stress: Not on file  Relationships  . Social Herbalist on phone: Not on file    Gets together: Not on file    Attends religious service: Not on file    Active member of club or organization: Not on file    Attends meetings of clubs or organizations: Not on file    Relationship status: Not on file  Other Topics Concern  . Not on file  Social History Narrative  . Not on file  Observations/Objective: Awake, alert and oriented x 3   Review of Systems  Constitutional: Negative for fever, malaise/fatigue and weight loss.  HENT: Negative.  Negative for nosebleeds.   Eyes: Negative.  Negative for blurred vision, double vision and photophobia.  Respiratory: Negative.  Negative for cough and shortness of breath.   Cardiovascular: Negative.  Negative for chest pain, palpitations and leg swelling.  Gastrointestinal: Negative.  Negative for heartburn, nausea and vomiting.  Musculoskeletal: Negative.  Negative for myalgias.  Neurological: Negative.  Negative  for dizziness, focal weakness, seizures and headaches.  Psychiatric/Behavioral: Negative.  Negative for suicidal ideas.    Assessment and Plan: Brenda Callahan was seen today for cough.  Diagnoses and all orders for this visit:  URI due to influenza     Follow Up Instructions Return if symptoms worsen or fail to improve.     I discussed the assessment and treatment plan with the patient. The patient was provided an opportunity to ask questions and all were answered. The patient agreed with the plan and demonstrated an understanding of the instructions.   The patient was advised to call back or seek an in-person evaluation if the symptoms worsen or if the condition fails to improve as anticipated.  I provided 14 minutes of non-face-to-face time during this encounter including median intraservice time, reviewing previous notes, labs, imaging, medications and explaining diagnosis and management.  Claiborne Rigg, FNP-BC

## 2019-04-18 ENCOUNTER — Other Ambulatory Visit: Payer: Medicare HMO

## 2019-06-12 ENCOUNTER — Ambulatory Visit: Payer: Medicare HMO | Admitting: Internal Medicine

## 2019-07-12 ENCOUNTER — Other Ambulatory Visit: Payer: Self-pay

## 2019-07-12 ENCOUNTER — Ambulatory Visit: Payer: Medicare HMO | Attending: Internal Medicine | Admitting: Internal Medicine

## 2019-07-12 DIAGNOSIS — N289 Disorder of kidney and ureter, unspecified: Secondary | ICD-10-CM | POA: Diagnosis not present

## 2019-07-12 DIAGNOSIS — E785 Hyperlipidemia, unspecified: Secondary | ICD-10-CM

## 2019-07-12 DIAGNOSIS — J392 Other diseases of pharynx: Secondary | ICD-10-CM

## 2019-07-12 DIAGNOSIS — I10 Essential (primary) hypertension: Secondary | ICD-10-CM

## 2019-07-12 DIAGNOSIS — E039 Hypothyroidism, unspecified: Secondary | ICD-10-CM

## 2019-07-12 MED ORDER — METOPROLOL SUCCINATE ER 50 MG PO TB24: 50.0000 mg | ORAL_TABLET | Freq: Every day | ORAL | 4 refills | Status: DC

## 2019-07-12 MED ORDER — ENALAPRIL MALEATE 20 MG PO TABS: 20.0000 mg | ORAL_TABLET | Freq: Every day | ORAL | 4 refills | Status: DC

## 2019-07-12 MED ORDER — OMEPRAZOLE 20 MG PO CPDR: DELAYED_RELEASE_CAPSULE | ORAL | 3 refills | Status: DC

## 2019-07-12 MED ORDER — LEVOTHYROXINE SODIUM 75 MCG PO TABS: 75.0000 ug | ORAL_TABLET | Freq: Every day | ORAL | 4 refills | Status: DC

## 2019-07-12 MED ORDER — AMLODIPINE BESYLATE 10 MG PO TABS: 10.0000 mg | ORAL_TABLET | Freq: Every day | ORAL | 4 refills | Status: DC

## 2019-07-12 MED ORDER — ATORVASTATIN CALCIUM 20 MG PO TABS: 20.0000 mg | ORAL_TABLET | Freq: Every day | ORAL | 6 refills | Status: DC

## 2019-07-12 NOTE — Progress Notes (Signed)
Virtual Visit via Telephone Note Due to current restrictions/limitations of in-office visits due to the COVID-19 pandemic, this scheduled clinical appointment was converted to a telehealth visit  I connected with Brenda Callahan on 07/12/19 at 4:00 p.m by telephone and verified that I am speaking with the correct person using two identifiers. I am in my office.  The patient is at home.  Only the patient, myself and Remon from [facility] [phone]) participated in this encounter.  I discussed the limitations, risks, security and privacy concerns of performing an evaluation and management service by telephone and the availability of in person appointments. I also discussed with the patient that there may be a patient responsible charge related to this service. The patient expressed understanding and agreed to proceed.   History of Present Illness: Pt with hx of HTN, HL, osteopenia and hypothyroidism.  Last seen by me 01/2019.  Purpose of today's visit is chronic ds management.  HYPERTENSION Currently taking: see medication list Med Adherence: '[x]'$  Yes    '[]'$  No Medication side effects: '[]'$  Yes    '[x]'$  No Adherence with salt restriction: '[x]'$  Yes    '[]'$  No Home Monitoring?: '[x]'$  Yes  daily Home BP results range: 120s/50-60 SOB? '[]'$  Yes    '[x]'$  No Chest Pain?: '[]'$  Yes    '[x]'$  No Leg swelling?: '[]'$  Yes    '[x]'$  No Headaches?: '[]'$  Yes    '[x]'$  No Dizziness? '[]'$  Yes    '[x]'$  No Comments:   HL: compliant with Lipitor.  Needs refill  C/o irritation in throat x 1 mth. She describes it as a burning and bad smell coming from the throat. Not the first that this has happen. No fever, HA.  Occasional cough Endorses burning in stomach if she drinks coffee and with certain foods.  No bitter taste in throat  Renal Insuff:  eGFR had dec from >60 to 42 when last checked in 01/2019.  Makes good urine.  Urinating more than usual.  Drinks 3-5 bottles water a day.  No dysuria. Wakes up twice at nights to  urinate Outpatient Encounter Medications as of 07/12/2019  Medication Sig  . amLODipine (NORVASC) 10 MG tablet Take 10 mg by mouth daily.  Marland Kitchen aspirin EC 81 MG tablet Take 81 mg by mouth daily.  Marland Kitchen atorvastatin (LIPITOR) 20 MG tablet Take 20 mg by mouth daily.  . enalapril (VASOTEC) 20 MG tablet Take 20 mg by mouth daily.  Marland Kitchen levothyroxine (SYNTHROID) 75 MCG tablet Take 75 mcg by mouth daily before breakfast.  . metoprolol succinate (TOPROL-XL) 50 MG 24 hr tablet Take 50 mg by mouth daily. Take with or immediately following a meal.  . Misc. Devices KIT One pair of below the knee compression stockings  . mometasone (NASONEX) 50 MCG/ACT nasal spray Place 2 sprays into the nose daily.   No facility-administered encounter medications on file as of 07/12/2019.    Observations/Objective: Results for orders placed or performed in visit on 02/01/19  CBC  Result Value Ref Range   WBC 6.1 3.4 - 10.8 x10E3/uL   RBC 4.54 3.77 - 5.28 x10E6/uL   Hemoglobin 13.1 11.1 - 15.9 g/dL   Hematocrit 37.9 34.0 - 46.6 %   MCV 84 79 - 97 fL   MCH 28.9 26.6 - 33.0 pg   MCHC 34.6 31.5 - 35.7 g/dL   RDW 14.4 11.7 - 15.4 %   Platelets 241 150 - 450 x10E3/uL  Comprehensive metabolic panel  Result Value Ref Range   Glucose  117 (H) 65 - 99 mg/dL   BUN 18 8 - 27 mg/dL   Creatinine, Ser 1.22 (H) 0.57 - 1.00 mg/dL   GFR calc non Af Amer 43 (L) >59 mL/min/1.73   GFR calc Af Amer 50 (L) >59 mL/min/1.73   BUN/Creatinine Ratio 15 12 - 28   Sodium 139 134 - 144 mmol/L   Potassium 4.6 3.5 - 5.2 mmol/L   Chloride 103 96 - 106 mmol/L   CO2 21 20 - 29 mmol/L   Calcium 9.7 8.7 - 10.3 mg/dL   Total Protein 7.9 6.0 - 8.5 g/dL   Albumin 4.6 3.7 - 4.7 g/dL   Globulin, Total 3.3 1.5 - 4.5 g/dL   Albumin/Globulin Ratio 1.4 1.2 - 2.2   Bilirubin Total 0.4 0.0 - 1.2 mg/dL   Alkaline Phosphatase 99 39 - 117 IU/L   AST 30 0 - 40 IU/L   ALT 15 0 - 32 IU/L  Lipid panel  Result Value Ref Range   Cholesterol, Total 169 100 - 199  mg/dL   Triglycerides 187 (H) 0 - 149 mg/dL   HDL 68 >39 mg/dL   VLDL Cholesterol Cal 37 5 - 40 mg/dL   LDL Calculated 64 0 - 99 mg/dL   Chol/HDL Ratio 2.5 0.0 - 4.4 ratio  TSH  Result Value Ref Range   TSH 3.090 0.450 - 4.500 uIU/mL  VITAMIN D 25 Hydroxy (Vit-D Deficiency, Fractures)  Result Value Ref Range   Vit D, 25-Hydroxy 31.9 30.0 - 100.0 ng/mL  Hgb A1c w/o eAG  Result Value Ref Range   Hgb A1c MFr Bld 5.5 4.8 - 5.6 %  Specimen status report  Result Value Ref Range   specimen status report Comment      Assessment and Plan: 1. Essential hypertension Home blood pressure readings are at goal.  Continue current medications and low-salt diet - amLODipine (NORVASC) 10 MG tablet; Take 1 tablet (10 mg total) by mouth daily.  Dispense: 30 tablet; Refill: 4 - enalapril (VASOTEC) 20 MG tablet; Take 1 tablet (20 mg total) by mouth daily.  Dispense: 30 tablet; Refill: 4 - metoprolol succinate (TOPROL-XL) 50 MG 24 hr tablet; Take 1 tablet (50 mg total) by mouth daily. Take with or immediately following a meal.  Dispense: 30 tablet; Refill: 4  2. Throat irritation Likely due to acid reflux.  We will put her on omeprazole for a week and then after that as needed.  We will try to avoid daily long-term use given recent renal insufficiency - omeprazole (PRILOSEC) 20 MG capsule; Take 1 tab daily x 7 days then daily PRN  Dispense: 30 capsule; Refill: 3  3. Hyperlipidemia, unspecified hyperlipidemia type - atorvastatin (LIPITOR) 20 MG tablet; Take 1 tablet (20 mg total) by mouth daily.  Dispense: 30 tablet; Refill: 6  4. Renal insufficiency Told to avoid taking NSAIDs.  Return to the lab next week for repeat BMP  5. Acquired hypothyroidism - levothyroxine (SYNTHROID) 75 MCG tablet; Take 1 tablet (75 mcg total) by mouth daily before breakfast.  Dispense: 30 tablet; Refill: 4   Follow Up Instructions: 3 months   I discussed the assessment and treatment plan with the patient. The patient  was provided an opportunity to ask questions and all were answered. The patient agreed with the plan and demonstrated an understanding of the instructions.   The patient was advised to call back or seek an in-person evaluation if the symptoms worsen or if the condition fails to improve as anticipated.  I provided 25 minutes of non-face-to-face time during this encounter.   Karle Plumber, MD

## 2019-07-12 NOTE — Progress Notes (Signed)
Pt states her throat is burning   Pt states she was told something was wrong with her kidneys and that may be the cause of her back pain

## 2019-07-24 ENCOUNTER — Other Ambulatory Visit: Payer: Self-pay

## 2019-07-24 ENCOUNTER — Ambulatory Visit: Payer: Medicare HMO | Attending: Internal Medicine

## 2019-07-24 DIAGNOSIS — N289 Disorder of kidney and ureter, unspecified: Secondary | ICD-10-CM

## 2019-07-25 LAB — BASIC METABOLIC PANEL
BUN/Creatinine Ratio: 15 (ref 12–28)
BUN: 14 mg/dL (ref 8–27)
CO2: 23 mmol/L (ref 20–29)
Calcium: 10.1 mg/dL (ref 8.7–10.3)
Chloride: 99 mmol/L (ref 96–106)
Creatinine, Ser: 0.93 mg/dL (ref 0.57–1.00)
GFR calc Af Amer: 70 mL/min/{1.73_m2} (ref >59)
GFR calc non Af Amer: 60 mL/min/{1.73_m2} (ref >59)
Glucose: 89 mg/dL (ref 65–99)
Potassium: 5 mmol/L (ref 3.5–5.2)
Sodium: 134 mmol/L (ref 134–144)

## 2019-07-27 ENCOUNTER — Telehealth: Payer: Self-pay

## 2019-07-27 NOTE — Telephone Encounter (Signed)
[employer] Lenord Fellers  Id# [id number]  contacted pt to go over lab results pt didn't answer left a detailed vm informing pt of results and if she has any questions or concerns to give a call back

## 2019-09-05 ENCOUNTER — Other Ambulatory Visit: Payer: Self-pay

## 2019-09-05 ENCOUNTER — Ambulatory Visit: Payer: Medicare HMO | Attending: Internal Medicine | Admitting: Physician Assistant

## 2019-09-05 DIAGNOSIS — K2970 Gastritis, unspecified, without bleeding: Secondary | ICD-10-CM | POA: Diagnosis not present

## 2019-09-05 DIAGNOSIS — Z789 Other specified health status: Secondary | ICD-10-CM

## 2019-09-05 MED ORDER — PANTOPRAZOLE SODIUM 40 MG PO TBEC: 40.0000 mg | DELAYED_RELEASE_TABLET | Freq: Every day | ORAL | 1 refills | Status: DC

## 2019-09-05 NOTE — Progress Notes (Signed)
Virtual Visit via Telephone Note  I connected with Brenda Callahan on 09/05/19 at 10:10 AM EST by telephone and verified that I am speaking with the correct person using two identifiers.   I discussed the limitations, risks, security and privacy concerns of performing an evaluation and management service by telephone and the availability of in person appointments. I also discussed with the patient that there may be a patient responsible charge related to this service. The patient expressed understanding and agreed to proceed.  PATIENT visit by telephone virtually in the context of Covid-19 pandemic. Patient location:  home My Location:  [facility] office Persons on the call:  Me, interpreter, and the patient  History of Present Illness:  Patient is calling due to issues with gastritis and acid reflux.  Wants referral to GI.  No melena or hematochezia.  Seen at [facility] 08/14/2019 for this.  No current abdominal pain or fever.  Was given pantoprazole and it is working for her.  She needs RF.      Observations/Objective:  NAD.  A&Ox3   Assessment and Plan: 1. Gastritis without bleeding, unspecified chronicity, unspecified gastritis type Stop omeprazole since pantoprazole is helping - Ambulatory referral to Gastroenterology - pantoprazole (PROTONIX) 40 MG tablet; Take 1 tablet (40 mg total) by mouth daily.  Dispense: 30 tablet; Refill: 1  2. Language barrier [employer] used and additional time performing visit was required.  Follow Up Instructions: See PCP in 4-6 weeks   I discussed the assessment and treatment plan with the patient. The patient was provided an opportunity to ask questions and all were answered. The patient agreed with the plan and demonstrated an understanding of the instructions.   The patient was advised to call back or seek an in-person evaluation if the symptoms worsen or if the condition fails to improve as anticipated.  I provided 19 minutes of  non-face-to-face time during this encounter.   Georgian Co, PA-C  Patient ID: Brenda Callahan, female   DOB: 02-07-1946, 74 y.o.   MRN: [id number]

## 2019-09-19 ENCOUNTER — Other Ambulatory Visit: Payer: Self-pay | Admitting: Pharmacist

## 2019-09-19 DIAGNOSIS — K2970 Gastritis, unspecified, without bleeding: Secondary | ICD-10-CM

## 2019-09-19 MED ORDER — PANTOPRAZOLE SODIUM 40 MG PO TBEC: 40.0000 mg | DELAYED_RELEASE_TABLET | Freq: Every day | ORAL | 0 refills | Status: DC

## 2019-12-10 ENCOUNTER — Other Ambulatory Visit: Payer: Self-pay | Admitting: Internal Medicine

## 2019-12-10 DIAGNOSIS — I10 Essential (primary) hypertension: Secondary | ICD-10-CM

## 2019-12-18 ENCOUNTER — Other Ambulatory Visit: Payer: Self-pay | Admitting: Internal Medicine

## 2019-12-18 DIAGNOSIS — K2970 Gastritis, unspecified, without bleeding: Secondary | ICD-10-CM

## 2020-02-05 ENCOUNTER — Ambulatory Visit: Payer: Medicare HMO | Admitting: Family Medicine

## 2020-02-09 ENCOUNTER — Other Ambulatory Visit: Payer: Self-pay | Admitting: Internal Medicine

## 2020-02-09 DIAGNOSIS — I10 Essential (primary) hypertension: Secondary | ICD-10-CM

## 2020-02-09 NOTE — Telephone Encounter (Signed)
Requested Prescriptions  Pending Prescriptions Disp Refills   enalapril (VASOTEC) 20 MG tablet [Pharmacy Med Name: ENALAPRIL MALEATE 20 MG TAB] 90 tablet 0    Sig: TOME UNA TABLETA TODOS LOS DIAS     Cardiovascular:  ACE Inhibitors Failed - 02/09/2020  9:18 AM      Failed - Cr in normal range and within 180 days    Creatinine, Ser  Date Value Ref Range Status  07/24/2019 0.93 0.57 - 1.00 mg/dL Final         Failed - K in normal range and within 180 days    Potassium  Date Value Ref Range Status  07/24/2019 5.0 3.5 - 5.2 mmol/L Final         Failed - Last BP in normal range    BP Readings from Last 1 Encounters:  02/01/19 (!) 146/50         Passed - Patient is not pregnant      Passed - Valid encounter within last 6 months    Recent Outpatient Visits          5 months ago Gastritis without bleeding, unspecified chronicity, unspecified gastritis type   [facility] [place], [place], [place]   7 months ago Essential hypertension   [facility] Marcine Matar, MD   9 months ago URI due to influenza   [facility] [place], Shea Stakes, NP   1 year ago Essential hypertension   [facility] [place], Binnie Rail, MD              amLODipine (NORVASC) 10 MG tablet [Pharmacy Med Name: AMLODIPINE BESYLATE 10 MG TAB] 90 tablet 0    Sig: TOME UNA TABLETA TODOS LOS DIAS     Cardiovascular:  Calcium Channel Blockers Failed - 02/09/2020  9:18 AM      Failed - Last BP in normal range    BP Readings from Last 1 Encounters:  02/01/19 (!) 146/50         Passed - Valid encounter within last 6 months    Recent Outpatient Visits          5 months ago Gastritis without bleeding, unspecified chronicity, unspecified gastritis type   [facility] [place], [place], [place]   7 months ago Essential hypertension   [facility] Marcine Matar, MD   9 months ago URI due to influenza   [facility] [place], Shea Stakes, NP   1 year ago Essential hypertension   [facility] Marcine Matar, MD

## 2020-05-08 ENCOUNTER — Other Ambulatory Visit: Payer: Self-pay | Admitting: Internal Medicine

## 2020-05-08 DIAGNOSIS — I10 Essential (primary) hypertension: Secondary | ICD-10-CM

## 2020-05-08 NOTE — Telephone Encounter (Signed)
Requested medications are due for refill today yes  Requested medications are on the active medication list yes  Last refill 10/14  Last visit Jan 2021  Future visit scheduled no  Notes to clinic Was to follow up in 3 months per OV note, no upcoming visit scheduled.

## 2020-06-05 ENCOUNTER — Other Ambulatory Visit: Payer: Self-pay | Admitting: Internal Medicine

## 2020-06-05 DIAGNOSIS — I10 Essential (primary) hypertension: Secondary | ICD-10-CM

## 2020-06-05 DIAGNOSIS — K2970 Gastritis, unspecified, without bleeding: Secondary | ICD-10-CM

## 2020-06-05 NOTE — Telephone Encounter (Signed)
Requested medication (s) are due for refill today: yes  Requested medication (s) are on the active medication list: yes  Last refill:  05/08/20 #30 0 refills  Future visit scheduled: no  Notes to clinic:  no valid encounter within 6 months, called patient via interpreter [phone] no answer , left voicemail to call clinic back to set up appt. Do you want to give courtesy refill?      Requested Prescriptions  Pending Prescriptions Disp Refills   amLODipine (NORVASC) 10 MG tablet [Pharmacy Med Name: AMLODIPINE BESYLATE 10 MG TAB] 30 tablet 0    Sig: TOME UNA TABLETA TODOS LOS DIAS      Cardiovascular:  Calcium Channel Blockers Failed - 06/05/2020  2:32 PM      Failed - Last BP in normal range    BP Readings from Last 1 Encounters:  02/01/19 (!) 146/50          Failed - Valid encounter within last 6 months    Recent Outpatient Visits           9 months ago Gastritis without bleeding, unspecified chronicity, unspecified gastritis type   [facility] [place], [place], [place]   10 months ago Essential hypertension   [facility] [place], Binnie Rail, MD   1 year ago URI due to influenza   [facility] [place], [place] W, NP   1 year ago Essential hypertension   [facility] Marcine Matar, MD                  enalapril (VASOTEC) 20 MG tablet [Pharmacy Med Name: ENALAPRIL MALEATE 20 MG TAB] 30 tablet 0    Sig: TOME UNA TABLETA TODOS LOS DIAS      Cardiovascular:  ACE Inhibitors Failed - 06/05/2020  2:32 PM      Failed - Cr in normal range and within 180 days    Creatinine, Ser  Date Value Ref Range Status  07/24/2019 0.93 0.57 - 1.00 mg/dL Final          Failed - K in normal range and within 180 days    Potassium  Date Value Ref Range Status  07/24/2019 5.0 3.5 - 5.2 mmol/L Final          Failed - Last BP in normal range    BP Readings from Last 1  Encounters:  02/01/19 (!) 146/50          Failed - Valid encounter within last 6 months    Recent Outpatient Visits           9 months ago Gastritis without bleeding, unspecified chronicity, unspecified gastritis type   [facility] [place], [place], [place]   10 months ago Essential hypertension   [facility] Marcine Matar, MD   1 year ago URI due to influenza   [facility] [place], Shea Stakes, NP   1 year ago Essential hypertension   [facility] Marcine Matar, MD                Passed - Patient is not pregnant

## 2020-06-06 NOTE — Telephone Encounter (Signed)
Mallory could you contact pt and schedule an appt

## 2020-07-04 ENCOUNTER — Other Ambulatory Visit: Payer: Self-pay | Admitting: Internal Medicine

## 2020-07-04 DIAGNOSIS — I10 Essential (primary) hypertension: Secondary | ICD-10-CM

## 2020-07-08 DIAGNOSIS — Z23 Encounter for immunization: Secondary | ICD-10-CM | POA: Diagnosis not present

## 2020-07-08 DIAGNOSIS — R4189 Other symptoms and signs involving cognitive functions and awareness: Secondary | ICD-10-CM | POA: Diagnosis not present

## 2020-07-08 DIAGNOSIS — M5416 Radiculopathy, lumbar region: Secondary | ICD-10-CM | POA: Diagnosis not present

## 2020-07-08 DIAGNOSIS — K219 Gastro-esophageal reflux disease without esophagitis: Secondary | ICD-10-CM | POA: Diagnosis not present

## 2020-07-09 ENCOUNTER — Other Ambulatory Visit: Payer: Self-pay | Admitting: Internal Medicine

## 2020-07-09 DIAGNOSIS — I10 Essential (primary) hypertension: Secondary | ICD-10-CM

## 2020-08-12 DIAGNOSIS — K219 Gastro-esophageal reflux disease without esophagitis: Secondary | ICD-10-CM | POA: Diagnosis not present

## 2020-08-12 DIAGNOSIS — Z23 Encounter for immunization: Secondary | ICD-10-CM | POA: Diagnosis not present

## 2020-08-12 DIAGNOSIS — R4189 Other symptoms and signs involving cognitive functions and awareness: Secondary | ICD-10-CM | POA: Diagnosis not present

## 2020-08-12 DIAGNOSIS — Z0001 Encounter for general adult medical examination with abnormal findings: Secondary | ICD-10-CM | POA: Diagnosis not present

## 2020-08-12 DIAGNOSIS — I10 Essential (primary) hypertension: Secondary | ICD-10-CM | POA: Diagnosis not present

## 2020-08-12 DIAGNOSIS — N390 Urinary tract infection, site not specified: Secondary | ICD-10-CM | POA: Diagnosis not present

## 2020-08-12 DIAGNOSIS — E785 Hyperlipidemia, unspecified: Secondary | ICD-10-CM | POA: Diagnosis not present

## 2020-08-12 DIAGNOSIS — E039 Hypothyroidism, unspecified: Secondary | ICD-10-CM | POA: Diagnosis not present

## 2020-08-12 DIAGNOSIS — M5416 Radiculopathy, lumbar region: Secondary | ICD-10-CM | POA: Diagnosis not present

## 2020-09-15 ENCOUNTER — Other Ambulatory Visit: Payer: Self-pay

## 2020-09-15 ENCOUNTER — Other Ambulatory Visit: Payer: Self-pay | Admitting: Internal Medicine

## 2020-09-15 DIAGNOSIS — E785 Hyperlipidemia, unspecified: Secondary | ICD-10-CM

## 2020-09-15 NOTE — Telephone Encounter (Addendum)
Patient due for follow up appointment vm left for patient to contact office Review for courtesy refill    Requested Prescriptions  Pending Prescriptions Disp Refills   atorvastatin (LIPITOR) 20 MG tablet [Pharmacy Med Name: ATORVASTATIN 20 MG TABLET] 90 tablet 2    Sig: TOME UNA TABLETA TODOS LOS DIAS      Cardiovascular:  Antilipid - Statins Failed - 09/15/2020  1:36 AM      Failed - Total Cholesterol in normal range and within 360 days    Cholesterol, Total  Date Value Ref Range Status  02/01/2019 169 100 - 199 mg/dL Final          Failed - LDL in normal range and within 360 days    LDL Calculated  Date Value Ref Range Status  02/01/2019 64 0 - 99 mg/dL Final          Failed - HDL in normal range and within 360 days    HDL  Date Value Ref Range Status  02/01/2019 68 >39 mg/dL Final          Failed - Triglycerides in normal range and within 360 days    Triglycerides  Date Value Ref Range Status  02/01/2019 187 (H) 0 - 149 mg/dL Final          Failed - Valid encounter within last 12 months    Recent Outpatient Visits           1 year ago Gastritis without bleeding, unspecified chronicity, unspecified gastritis type   [facility] [place], Marzella Schlein, [place]   1 year ago Essential hypertension   [facility] Marcine Matar, MD   1 year ago URI due to influenza   [facility] [place], Shea Stakes, NP   1 year ago Essential hypertension   [facility] Marcine Matar, MD                Passed - Patient is not pregnant

## 2020-09-19 DIAGNOSIS — I10 Essential (primary) hypertension: Secondary | ICD-10-CM | POA: Diagnosis not present

## 2020-09-19 DIAGNOSIS — R69 Illness, unspecified: Secondary | ICD-10-CM | POA: Diagnosis not present

## 2020-09-19 DIAGNOSIS — Z7982 Long term (current) use of aspirin: Secondary | ICD-10-CM | POA: Diagnosis not present

## 2020-09-19 DIAGNOSIS — K219 Gastro-esophageal reflux disease without esophagitis: Secondary | ICD-10-CM | POA: Diagnosis not present

## 2020-09-19 DIAGNOSIS — E785 Hyperlipidemia, unspecified: Secondary | ICD-10-CM | POA: Diagnosis not present

## 2020-09-19 DIAGNOSIS — Z6829 Body mass index (BMI) 29.0-29.9, adult: Secondary | ICD-10-CM | POA: Diagnosis not present

## 2020-09-19 DIAGNOSIS — E663 Overweight: Secondary | ICD-10-CM | POA: Diagnosis not present

## 2020-09-19 DIAGNOSIS — E039 Hypothyroidism, unspecified: Secondary | ICD-10-CM | POA: Diagnosis not present

## 2020-11-03 DIAGNOSIS — Z1231 Encounter for screening mammogram for malignant neoplasm of breast: Secondary | ICD-10-CM | POA: Diagnosis not present

## 2020-12-02 DIAGNOSIS — E559 Vitamin D deficiency, unspecified: Secondary | ICD-10-CM | POA: Diagnosis not present

## 2020-12-02 DIAGNOSIS — Z0001 Encounter for general adult medical examination with abnormal findings: Secondary | ICD-10-CM | POA: Diagnosis not present

## 2020-12-02 DIAGNOSIS — N390 Urinary tract infection, site not specified: Secondary | ICD-10-CM | POA: Diagnosis not present

## 2020-12-02 DIAGNOSIS — R300 Dysuria: Secondary | ICD-10-CM | POA: Diagnosis not present

## 2020-12-02 DIAGNOSIS — R102 Pelvic and perineal pain: Secondary | ICD-10-CM | POA: Diagnosis not present

## 2020-12-11 DIAGNOSIS — N390 Urinary tract infection, site not specified: Secondary | ICD-10-CM | POA: Diagnosis not present

## 2020-12-11 DIAGNOSIS — N760 Acute vaginitis: Secondary | ICD-10-CM | POA: Diagnosis not present

## 2020-12-11 DIAGNOSIS — Z0000 Encounter for general adult medical examination without abnormal findings: Secondary | ICD-10-CM | POA: Diagnosis not present

## 2020-12-11 DIAGNOSIS — E785 Hyperlipidemia, unspecified: Secondary | ICD-10-CM | POA: Diagnosis not present

## 2020-12-11 DIAGNOSIS — E039 Hypothyroidism, unspecified: Secondary | ICD-10-CM | POA: Diagnosis not present

## 2020-12-11 DIAGNOSIS — I10 Essential (primary) hypertension: Secondary | ICD-10-CM | POA: Diagnosis not present

## 2020-12-11 DIAGNOSIS — K219 Gastro-esophageal reflux disease without esophagitis: Secondary | ICD-10-CM | POA: Diagnosis not present

## 2021-01-02 DIAGNOSIS — H524 Presbyopia: Secondary | ICD-10-CM | POA: Diagnosis not present

## 2021-07-17 DIAGNOSIS — G5701 Lesion of sciatic nerve, right lower limb: Secondary | ICD-10-CM | POA: Diagnosis not present

## 2021-07-17 DIAGNOSIS — K219 Gastro-esophageal reflux disease without esophagitis: Secondary | ICD-10-CM | POA: Diagnosis not present

## 2021-07-17 DIAGNOSIS — Z0000 Encounter for general adult medical examination without abnormal findings: Secondary | ICD-10-CM | POA: Diagnosis not present

## 2021-07-17 DIAGNOSIS — E039 Hypothyroidism, unspecified: Secondary | ICD-10-CM | POA: Diagnosis not present

## 2021-07-17 DIAGNOSIS — E559 Vitamin D deficiency, unspecified: Secondary | ICD-10-CM | POA: Diagnosis not present

## 2021-07-17 DIAGNOSIS — Z0001 Encounter for general adult medical examination with abnormal findings: Secondary | ICD-10-CM | POA: Diagnosis not present

## 2021-07-17 DIAGNOSIS — I129 Hypertensive chronic kidney disease with stage 1 through stage 4 chronic kidney disease, or unspecified chronic kidney disease: Secondary | ICD-10-CM | POA: Diagnosis not present

## 2021-07-17 DIAGNOSIS — Z23 Encounter for immunization: Secondary | ICD-10-CM | POA: Diagnosis not present

## 2021-07-17 DIAGNOSIS — B359 Dermatophytosis, unspecified: Secondary | ICD-10-CM | POA: Diagnosis not present

## 2021-07-17 DIAGNOSIS — N390 Urinary tract infection, site not specified: Secondary | ICD-10-CM | POA: Diagnosis not present

## 2021-07-17 DIAGNOSIS — E785 Hyperlipidemia, unspecified: Secondary | ICD-10-CM | POA: Diagnosis not present

## 2021-07-17 DIAGNOSIS — R4189 Other symptoms and signs involving cognitive functions and awareness: Secondary | ICD-10-CM | POA: Diagnosis not present

## 2021-07-23 ENCOUNTER — Telehealth: Payer: Self-pay | Admitting: Family Medicine

## 2021-07-23 NOTE — Telephone Encounter (Signed)
See below

## 2021-07-23 NOTE — Telephone Encounter (Signed)
Pt called wanting to est as new pt, pt was recommended by Dr. Delfino Lovett from [facility] facility. Pt is really wanting with female provider and that understands spanish. Please advise. Pt tel [phone].

## 2021-07-23 NOTE — Telephone Encounter (Signed)
Okay to establish.

## 2021-07-23 NOTE — Telephone Encounter (Signed)
Spoke with pt and informed the below, pt understood. Done

## 2021-08-21 DIAGNOSIS — E039 Hypothyroidism, unspecified: Secondary | ICD-10-CM | POA: Diagnosis not present

## 2021-08-21 DIAGNOSIS — N1832 Chronic kidney disease, stage 3b: Secondary | ICD-10-CM | POA: Diagnosis not present

## 2021-08-21 DIAGNOSIS — N183 Chronic kidney disease, stage 3 unspecified: Secondary | ICD-10-CM | POA: Diagnosis not present

## 2021-08-21 DIAGNOSIS — E785 Hyperlipidemia, unspecified: Secondary | ICD-10-CM | POA: Diagnosis not present

## 2021-08-21 DIAGNOSIS — I10 Essential (primary) hypertension: Secondary | ICD-10-CM | POA: Diagnosis not present

## 2021-09-18 DIAGNOSIS — K219 Gastro-esophageal reflux disease without esophagitis: Secondary | ICD-10-CM | POA: Diagnosis not present

## 2021-09-18 DIAGNOSIS — R69 Illness, unspecified: Secondary | ICD-10-CM | POA: Diagnosis not present

## 2021-09-18 DIAGNOSIS — Z7982 Long term (current) use of aspirin: Secondary | ICD-10-CM | POA: Diagnosis not present

## 2021-09-18 DIAGNOSIS — E663 Overweight: Secondary | ICD-10-CM | POA: Diagnosis not present

## 2021-09-18 DIAGNOSIS — H269 Unspecified cataract: Secondary | ICD-10-CM | POA: Diagnosis not present

## 2021-09-18 DIAGNOSIS — I10 Essential (primary) hypertension: Secondary | ICD-10-CM | POA: Diagnosis not present

## 2021-09-18 DIAGNOSIS — Z6827 Body mass index (BMI) 27.0-27.9, adult: Secondary | ICD-10-CM | POA: Diagnosis not present

## 2021-09-18 DIAGNOSIS — E039 Hypothyroidism, unspecified: Secondary | ICD-10-CM | POA: Diagnosis not present

## 2021-09-18 DIAGNOSIS — Z008 Encounter for other general examination: Secondary | ICD-10-CM | POA: Diagnosis not present

## 2021-09-18 DIAGNOSIS — Z8249 Family history of ischemic heart disease and other diseases of the circulatory system: Secondary | ICD-10-CM | POA: Diagnosis not present

## 2021-09-18 DIAGNOSIS — E785 Hyperlipidemia, unspecified: Secondary | ICD-10-CM | POA: Diagnosis not present

## 2021-11-19 ENCOUNTER — Ambulatory Visit: Payer: Medicare HMO | Admitting: Emergency Medicine

## 2021-11-24 DIAGNOSIS — Z1231 Encounter for screening mammogram for malignant neoplasm of breast: Secondary | ICD-10-CM | POA: Diagnosis not present

## 2021-11-24 LAB — HM MAMMOGRAPHY

## 2021-11-25 DIAGNOSIS — H11041 Peripheral pterygium, stationary, right eye: Secondary | ICD-10-CM | POA: Diagnosis not present

## 2021-11-25 DIAGNOSIS — H2513 Age-related nuclear cataract, bilateral: Secondary | ICD-10-CM | POA: Diagnosis not present

## 2021-11-25 DIAGNOSIS — H35373 Puckering of macula, bilateral: Secondary | ICD-10-CM | POA: Diagnosis not present

## 2021-11-26 ENCOUNTER — Encounter: Payer: Self-pay | Admitting: Emergency Medicine

## 2021-11-26 ENCOUNTER — Ambulatory Visit (INDEPENDENT_AMBULATORY_CARE_PROVIDER_SITE_OTHER): Payer: Medicare HMO | Admitting: Emergency Medicine

## 2021-11-26 VITALS — BP 132/60 | HR 63 | Temp 97.5°F | Ht <= 58 in | Wt 128.1 lb

## 2021-11-26 DIAGNOSIS — I10 Essential (primary) hypertension: Secondary | ICD-10-CM

## 2021-11-26 DIAGNOSIS — E785 Hyperlipidemia, unspecified: Secondary | ICD-10-CM | POA: Diagnosis not present

## 2021-11-26 DIAGNOSIS — Z7689 Persons encountering health services in other specified circumstances: Secondary | ICD-10-CM

## 2021-11-26 DIAGNOSIS — E039 Hypothyroidism, unspecified: Secondary | ICD-10-CM

## 2021-11-26 LAB — CBC WITH DIFFERENTIAL/PLATELET
Basophils Absolute: 0 10*3/uL (ref 0.0–0.1)
Basophils Relative: 0.5 % (ref 0.0–3.0)
Eosinophils Absolute: 0.1 10*3/uL (ref 0.0–0.7)
Eosinophils Relative: 1.7 % (ref 0.0–5.0)
HCT: 36.8 % (ref 36.0–46.0)
Hemoglobin: 12.6 g/dL (ref 12.0–15.0)
Lymphocytes Relative: 29.8 % (ref 12.0–46.0)
Lymphs Abs: 1.7 10*3/uL (ref 0.7–4.0)
MCHC: 34.4 g/dL (ref 30.0–36.0)
MCV: 83.2 fl (ref 78.0–100.0)
Monocytes Absolute: 0.5 10*3/uL (ref 0.1–1.0)
Monocytes Relative: 9.4 % (ref 3.0–12.0)
Neutro Abs: 3.4 10*3/uL (ref 1.4–7.7)
Neutrophils Relative %: 58.6 % (ref 43.0–77.0)
Platelets: 191 10*3/uL (ref 150.0–400.0)
RBC: 4.42 Mil/uL (ref 3.87–5.11)
RDW: 14.6 % (ref 11.5–15.5)
WBC: 5.8 10*3/uL (ref 4.0–10.5)

## 2021-11-26 LAB — COMPREHENSIVE METABOLIC PANEL
ALT: 11 U/L (ref 0–35)
AST: 24 U/L (ref 0–37)
Albumin: 4.2 g/dL (ref 3.5–5.2)
Alkaline Phosphatase: 83 U/L (ref 39–117)
BUN: 20 mg/dL (ref 6–23)
CO2: 25 mEq/L (ref 19–32)
Calcium: 9.8 mg/dL (ref 8.4–10.5)
Chloride: 103 mEq/L (ref 96–112)
Creatinine, Ser: 0.93 mg/dL (ref 0.40–1.20)
GFR: 59.1 mL/min — ABNORMAL LOW (ref >60.00)
Glucose, Bld: 92 mg/dL (ref 70–99)
Potassium: 4.5 mEq/L (ref 3.5–5.1)
Sodium: 137 mEq/L (ref 135–145)
Total Bilirubin: 0.6 mg/dL (ref 0.2–1.2)
Total Protein: 8.1 g/dL (ref 6.0–8.3)

## 2021-11-26 LAB — LIPID PANEL
Cholesterol: 248 mg/dL — ABNORMAL HIGH (ref 0–200)
HDL: 78.4 mg/dL (ref >39.00)
LDL Cholesterol: 153 mg/dL — ABNORMAL HIGH (ref 0–99)
NonHDL: 169.47
Total CHOL/HDL Ratio: 3
Triglycerides: 83 mg/dL (ref 0.0–149.0)
VLDL: 16.6 mg/dL (ref 0.0–40.0)

## 2021-11-26 NOTE — Assessment & Plan Note (Signed)
Stable.  Continue atorvastatin 40 mg daily.  Diet and nutrition discussed. Lipid profile done today.

## 2021-11-26 NOTE — Progress Notes (Signed)
Brenda Callahan 76 y.o.   Chief Complaint  Patient presents with   New Patient (Initial Visit)    No concerns     HISTORY OF PRESENT ILLNESS: This is a 76 y.o. female first visit to this office, here to establish care with me. Here with husband and son.  Originally from [place]. Has history of hypertension, hypothyroidism, and dyslipidemia Also taking donezepil for memory problems. No other complaints or medical concerns today.  HPI   Prior to Admission medications   Medication Sig Start Date End Date Taking? Authorizing Provider  amLODipine (NORVASC) 10 MG tablet TOME UNA TABLETA TODOS LOS DIAS 06/05/20  Yes Ladell Pier, MD  aspirin EC 81 MG tablet Take 81 mg by mouth daily.   Yes [provider]  atorvastatin (LIPITOR) 20 MG tablet Take 1 tablet (20 mg total) by mouth daily. 07/12/19  Yes Ladell Pier, MD  enalapril (VASOTEC) 20 MG tablet TOME UNA TABLETA TODOS LOS DIAS 06/05/20  Yes Ladell Pier, MD  levothyroxine (SYNTHROID) 75 MCG tablet Take 1 tablet (75 mcg total) by mouth daily before breakfast. 07/12/19  Yes Ladell Pier, MD  metoprolol succinate (TOPROL-XL) 50 MG 24 hr tablet Take 1 tablet (50 mg total) by mouth daily. Take with or immediately following a meal. 07/12/19  Yes Ladell Pier, MD  Misc. Devices KIT One pair of below the knee compression stockings 10/21/17  Yes Pollina, Gwenyth Allegra, MD  mometasone (NASONEX) 50 MCG/ACT nasal spray Place 2 sprays into the nose daily. 10/21/17  Yes Pollina, Gwenyth Allegra, MD  pantoprazole (PROTONIX) 40 MG tablet TOME UNA TABLETA TODOS LOS DIAS 06/05/20  Yes Ladell Pier, MD    No Known Allergies  Patient Active Problem List   Diagnosis Date Noted   Essential hypertension 02/01/2019   Hyperlipidemia 02/01/2019   Acquired hypothyroidism 02/01/2019   Osteopenia 02/01/2019    Past Medical History:  Diagnosis Date   Typhoid     Past Surgical History:  Procedure Laterality Date    CESAREAN SECTION      Social History   Socioeconomic History   Marital status: Married    Spouse name: Not on file   Number of children: 2   Years of education: Not on file   Highest education level: 3rd grade  Occupational History   Occupation: retired  Tobacco Use   Smoking status: Never   Smokeless tobacco: Never  Vaping Use   Vaping Use: Never used  Substance and Sexual Activity   Alcohol use: Not Currently   Drug use: Never   Sexual activity: Not on file  Other Topics Concern   Not on file  Social History Narrative   Not on file   Social Determinants of Health   Financial Resource Strain: Not on file  Food Insecurity: Not on file  Transportation Needs: Not on file  Physical Activity: Not on file  Stress: Not on file  Social Connections: Not on file  Intimate Partner Violence: Not on file    Family History  Problem Relation Age of Onset   Hypertension Sister      Review of Systems  Constitutional: Negative.  Negative for chills and fever.  HENT: Negative.  Negative for congestion and sore throat.   Respiratory: Negative.  Negative for cough and shortness of breath.   Cardiovascular: Negative.  Negative for chest pain and palpitations.  Gastrointestinal:  Negative for abdominal pain, diarrhea, nausea and vomiting.  Genitourinary: Negative.  Negative for dysuria  and hematuria.  Skin: Negative.  Negative for rash.  Neurological:  Negative for dizziness and headaches.  All other systems reviewed and are negative. Today's Vitals   11/26/21 0920  BP: 132/60  Pulse: 63  Temp: (!) 97.5 F (36.4 C)  TempSrc: Oral  SpO2: 97%  Weight: 128 lb 2 oz (58.1 kg)   Body mass index is 26.78 kg/m.   Physical Exam Vitals reviewed.  Constitutional:      Appearance: Normal appearance.  HENT:     Head: Normocephalic.     Right Ear: Tympanic membrane, ear canal and external ear normal.     Left Ear: Tympanic membrane, ear canal and external ear normal.      Mouth/Throat:     Mouth: Mucous membranes are moist.     Pharynx: Oropharynx is clear.  Eyes:     Extraocular Movements: Extraocular movements intact.     Conjunctiva/sclera: Conjunctivae normal.     Pupils: Pupils are equal, round, and reactive to light.  Cardiovascular:     Rate and Rhythm: Normal rate and regular rhythm.     Pulses: Normal pulses.     Heart sounds: Normal heart sounds.  Pulmonary:     Effort: Pulmonary effort is normal.     Breath sounds: Normal breath sounds.  Abdominal:     General: There is no distension.     Palpations: Abdomen is soft.     Tenderness: There is no abdominal tenderness.  Musculoskeletal:        General: Normal range of motion.     Cervical back: No tenderness.     Right lower leg: No edema.     Left lower leg: No edema.  Lymphadenopathy:     Cervical: No cervical adenopathy.  Skin:    General: Skin is warm and dry.     Capillary Refill: Capillary refill takes less than 2 seconds.  Neurological:     General: No focal deficit present.     Mental Status: She is alert and oriented to person, place, and time.  Psychiatric:        Mood and Affect: Mood normal.        Behavior: Behavior normal.     ASSESSMENT & PLAN: A total of 50 minutes was spent with the patient and counseling/coordination of care regarding preparing for this visit, establishing care with me, review of available medical records, review of multiple chronic medical problems and their management, review of all medications, comprehensive history and physical examination, cardiovascular risks associated with hypertension and dyslipidemia, education on nutrition, prognosis, documentation and need for follow-up.  Problem List Items Addressed This Visit       Cardiovascular and Mediastinum   Essential hypertension - Primary    Well-controlled hypertension. Continue enalapril 20 mg, amlodipine 10 mg, and metoprolol succinate 50 mg daily. BP Readings from Last 3 Encounters:   11/26/21 132/60  02/01/19 (!) 146/50  10/21/17 (!) 120/58         Relevant Orders   CBC with Differential/Platelet   Comprehensive metabolic panel     Endocrine   Acquired hypothyroidism    Clinically euthyroid.  Blood work done today. Continue Synthroid 88 mcg daily.       Relevant Orders   Thyroid Panel With TSH     Other   Hyperlipidemia    Stable.  Continue atorvastatin 40 mg daily.  Diet and nutrition discussed. Lipid profile done today.       Other Visit Diagnoses  Encounter to establish care          Patient Instructions  Mantenimiento de la salud despus de los [place] Maintenance After Age 91 Despus de los 65 aos de edad, corre un riesgo mayor de Tourist information centre manager enfermedades e infecciones a Barrister's clerk, como tambin de sufrir lesiones por cadas. Las cadas son la causa principal de las fracturas de huesos y lesiones en la cabeza de personas mayores de 56 aos de edad. Recibir cuidados preventivos de forma regular puede ayudarlo a mantenerse saludable y en buen [place]. Los cuidados preventivos incluyen realizarse anlisis de forma regular y Actor en el estilo de vida segn las recomendaciones del mdico. Converse con el mdico sobre lo siguiente: Las pruebas de deteccin y los anlisis que debe Dispensing optician. Una prueba de deteccin es un estudio que se para Hydrographic surveyor la presencia de una enfermedad cuando no tiene sntomas. Un plan de dieta y ejercicios adecuado para usted. Qu debo saber sobre las pruebas de deteccin y los anlisis para prevenir cadas? Realizarse pruebas de deteccin y [employer] es la mejor manera de Hydrographic surveyor un problema de salud de forma temprana. El diagnstico y tratamiento tempranos le brindan la mejor oportunidad de Chief Technology Officer las afecciones mdicas que son comunes despus de los 67 aos de edad. Ciertas afecciones y elecciones de estilo de vida pueden hacer que sea ms propenso a sufrir Engineer, manufacturing. El mdico puede  recomendarle lo siguiente: Controles regulares de la visin. Una visin deficiente y afecciones como las cataratas pueden hacer que sea ms propenso a sufrir Engineer, manufacturing. Si [place] lentes, asegrese de obtener una receta actualizada si su visin cambia. Revisin de medicamentos. Revise regularmente con el mdico todos los medicamentos que toma, incluidos los medicamentos de [place]. Consulte al [employer] efectos secundarios que pueden hacer que sea ms propenso a sufrir Engineer, manufacturing. Informe al mdico si alguno de los medicamentos que toma lo hace sentir mareado o somnoliento. Controles de fuerza y equilibrio. El mdico puede recomendar ciertos estudios para controlar su fuerza y equilibrio al estar de pie, al caminar o al cambiar de posicin. Examen de los pies. El dolor y Chiropractor en los pies, como tambin no utilizar el calzado [place], pueden hacer que sea ms propenso a sufrir Engineer, manufacturing. Pruebas de deteccin, que incluyen las siguientes: Pruebas de deteccin para la osteoporosis. La osteoporosis es una afeccin que hace que los huesos se tornen ms dbiles y se quiebren con ms facilidad. Pruebas de deteccin para la presin arterial. Los cambios en la presin arterial y los medicamentos para Chief Technology Officer la presin arterial pueden hacerlo sentir mareado. Prueba de deteccin de la depresin. Es ms probable que sufra una cada si tiene miedo a caerse, se siente deprimido o se siente incapaz de Patent examiner. Prueba de deteccin de consumo de alcohol. Beber demasiado alcohol puede afectar su equilibrio y puede hacer que sea ms propenso a sufrir Engineer, manufacturing. Siga estas indicaciones en su casa: Estilo de vida No beba alcohol si: Su mdico le indica no hacerlo. Si bebe alcohol: Limite la cantidad que bebe a lo siguiente: De 0 a 1 medida por da para las mujeres. De 0 a 2 medidas por da para los hombres. Sepa cunta cantidad de alcohol hay en las bebidas que toma.  En los [place], una medida equivale a una botella de cerveza de 12 oz (355 ml), un vaso de vino de 5 oz (148 ml) o un vaso  de una bebida alcohlica de alta graduacin de 1 oz (44 ml). No consuma ningn producto que contenga nicotina o tabaco. Estos productos incluyen cigarrillos, tabaco para Higher education careers adviser y aparatos de vapeo, como los Psychologist, sport and exercise. Si necesita ayuda para dejar de consumir estos productos, consulte al [employer]. Actividad  Siga un programa de ejercicio regular para mantenerse en forma. Esto lo ayudar a Contractor equilibrio. Consulte al mdico qu tipos de ejercicios son adecuados para usted. Si necesita un bastn o un andador, selo segn las recomendaciones del mdico. Utilice calzado con buen apoyo y suela antideslizante. Seguridad  Retire los [place] puedan causar tropiezos tales como alfombras, cables u obstculos. Instale equipos de seguridad, como barras para sostn en los baos y barandas de seguridad en las escaleras. [place] habitaciones y los pasillos bien iluminados. Indicaciones generales Hable con el mdico sobre sus riesgos de sufrir una cada. Infrmele a su mdico si: Se cae. Asegrese de informarle a su mdico acerca de todas las cadas, incluso aquellas que parecen ser [employer] Se siente mareado, cansado (tiene fatiga) o siente que pierde el equilibrio. Use los medicamentos de venta libre y los recetados solamente como se lo haya indicado el mdico. Estos incluyen suplementos. Siga una dieta sana y [place] un peso saludable. Una dieta saludable incluye productos lcteos descremados, carnes bajas en contenido de grasa ([place]), fibra de granos enteros, frijoles y [place] frutas y verduras. [place]. Realcese los estudios de rutina de la salud, dentales y de Public librarian. Resumen Tener un estilo de vida saludable y recibir cuidados preventivos pueden ayudar a Theatre stage manager salud y el bienestar despus de los 37 aos de  [place]. Realizarse pruebas de deteccin y [employer] es la mejor manera de Hydrographic surveyor un problema de salud de forma temprana y Lourena Simmonds a Product/process development scientist una cada. El diagnstico y tratamiento tempranos le brindan la mejor oportunidad de Chief Technology Officer las afecciones mdicas ms comunes en las personas mayores de 55 aos de edad. Las cadas son la causa principal de las fracturas de huesos y lesiones en la cabeza de personas mayores de 52 aos de edad. Tome precauciones para evitar una cada en su casa. Trabaje con el mdico para saber qu cambios que puede hacer para mejorar su salud y [place], y [place]. Esta informacin no tiene Marine scientist el consejo del mdico. Asegrese de hacerle al mdico cualquier pregunta que tenga. Document Revised: 11/19/2020 Document Reviewed: 11/19/2020 Elsevier Patient Education  [place], MD [facility]

## 2021-11-26 NOTE — Patient Instructions (Signed)
Mantenimiento de la salud despus de los 65 aos de edad Health Maintenance After Age 78 Despus de los 65 aos de edad, corre un riesgo mayor de padecer ciertas enfermedades e infecciones a largo plazo, como tambin de sufrir lesiones por cadas. Las cadas son la causa principal de las fracturas de huesos y lesiones en la cabeza de personas mayores de 65 aos de edad. Recibir cuidados preventivos de forma regular puede ayudarlo a mantenerse saludable y en buen estado. Los cuidados preventivos incluyen realizarse anlisis de forma regular y realizar cambios en el estilo de vida segn las recomendaciones del mdico. Converse con el mdico sobre lo siguiente: Las pruebas de deteccin y los anlisis que debe realizarse. Una prueba de deteccin es un estudio que se para detectar la presencia de una enfermedad cuando no tiene sntomas. Un plan de dieta y ejercicios adecuado para usted. Qu debo saber sobre las pruebas de deteccin y los anlisis para prevenir cadas? Realizarse pruebas de deteccin y anlisis es la mejor manera de detectar un problema de salud de forma temprana. El diagnstico y tratamiento tempranos le brindan la mejor oportunidad de controlar las afecciones mdicas que son comunes despus de los 65 aos de edad. Ciertas afecciones y elecciones de estilo de vida pueden hacer que sea ms propenso a sufrir una cada. El mdico puede recomendarle lo siguiente: Controles regulares de la visin. Una visin deficiente y afecciones como las cataratas pueden hacer que sea ms propenso a sufrir una cada. Si usa lentes, asegrese de obtener una receta actualizada si su visin cambia. Revisin de medicamentos. Revise regularmente con el mdico todos los medicamentos que toma, incluidos los medicamentos de venta libre. Consulte al mdico sobre los efectos secundarios que pueden hacer que sea ms propenso a sufrir una cada. Informe al mdico si alguno de los medicamentos que toma lo hace sentir mareado o  somnoliento. Controles de fuerza y equilibrio. El mdico puede recomendar ciertos estudios para controlar su fuerza y equilibrio al estar de pie, al caminar o al cambiar de posicin. Examen de los pies. El dolor y el adormecimiento en los pies, como tambin no utilizar el calzado adecuado, pueden hacer que sea ms propenso a sufrir una cada. Pruebas de deteccin, que incluyen las siguientes: Pruebas de deteccin para la osteoporosis. La osteoporosis es una afeccin que hace que los huesos se tornen ms dbiles y se quiebren con ms facilidad. Pruebas de deteccin para la presin arterial. Los cambios en la presin arterial y los medicamentos para controlar la presin arterial pueden hacerlo sentir mareado. Prueba de deteccin de la depresin. Es ms probable que sufra una cada si tiene miedo a caerse, se siente deprimido o se siente incapaz de realizar actividades que sola hacer. Prueba de deteccin de consumo de alcohol. Beber demasiado alcohol puede afectar su equilibrio y puede hacer que sea ms propenso a sufrir una cada. Siga estas indicaciones en su casa: Estilo de vida No beba alcohol si: Su mdico le indica no hacerlo. Si bebe alcohol: Limite la cantidad que bebe a lo siguiente: De 0 a 1 medida por da para las mujeres. De 0 a 2 medidas por da para los hombres. Sepa cunta cantidad de alcohol hay en las bebidas que toma. En los [place], una medida equivale a una botella de cerveza de 12 oz (355 ml), un vaso de vino de 5 oz (148 ml) o un vaso de una bebida alcohlica de alta graduacin de 1 oz (44 ml). No consuma ningn producto que   contenga nicotina o tabaco. Estos productos incluyen cigarrillos, tabaco para mascar y aparatos de vapeo, como los cigarrillos electrnicos. Si necesita ayuda para dejar de consumir estos productos, consulte al mdico. Actividad  Siga un programa de ejercicio regular para mantenerse en forma. Esto lo ayudar a mantener el equilibrio. Consulte al  mdico qu tipos de ejercicios son adecuados para usted. Si necesita un bastn o un andador, selo segn las recomendaciones del mdico. Utilice calzado con buen apoyo y suela antideslizante. Seguridad  Retire los objetos que puedan causar tropiezos tales como alfombras, cables u obstculos. Instale equipos de seguridad, como barras para sostn en los baos y barandas de seguridad en las escaleras. Mantenga las habitaciones y los pasillos bien iluminados. Indicaciones generales Hable con el mdico sobre sus riesgos de sufrir una cada. Infrmele a su mdico si: Se cae. Asegrese de informarle a su mdico acerca de todas las cadas, incluso aquellas que parecen ser menores. Se siente mareado, cansado (tiene fatiga) o siente que pierde el equilibrio. Use los medicamentos de venta libre y los recetados solamente como se lo haya indicado el mdico. Estos incluyen suplementos. Siga una dieta sana y mantenga un peso saludable. Una dieta saludable incluye productos lcteos descremados, carnes bajas en contenido de grasa (magras), fibra de granos enteros, frijoles y muchas frutas y verduras. Mantngase al da con las vacunas. Realcese los estudios de rutina de la salud, dentales y de la vista. Resumen Tener un estilo de vida saludable y recibir cuidados preventivos pueden ayudar a promover la salud y el bienestar despus de los 65 aos de edad. Realizarse pruebas de deteccin y anlisis es la mejor manera de detectar un problema de salud de forma temprana y ayudarlo a evitar una cada. El diagnstico y tratamiento tempranos le brindan la mejor oportunidad de controlar las afecciones mdicas ms comunes en las personas mayores de 65 aos de edad. Las cadas son la causa principal de las fracturas de huesos y lesiones en la cabeza de personas mayores de 65 aos de edad. Tome precauciones para evitar una cada en su casa. Trabaje con el mdico para saber qu cambios que puede hacer para mejorar su salud y  bienestar, y para prevenir las cadas. Esta informacin no tiene como fin reemplazar el consejo del mdico. Asegrese de hacerle al mdico cualquier pregunta que tenga. Document Revised: 11/19/2020 Document Reviewed: 11/19/2020 Elsevier Patient Education  2023 Elsevier Inc.  

## 2021-11-26 NOTE — Assessment & Plan Note (Signed)
Clinically euthyroid.  Blood work done today. Continue Synthroid 88 mcg daily.

## 2021-11-26 NOTE — Assessment & Plan Note (Signed)
Well-controlled hypertension. Continue enalapril 20 mg, amlodipine 10 mg, and metoprolol succinate 50 mg daily. BP Readings from Last 3 Encounters:  11/26/21 132/60  02/01/19 (!) 146/50  10/21/17 (!) 120/58

## 2021-11-27 LAB — HEMOGLOBIN A1C
Est. average glucose Bld gHb Est-mCnc: 100 mg/dL
Hgb A1c MFr Bld: 5.1 % (ref 4.8–5.6)

## 2021-11-27 LAB — THYROID PANEL WITH TSH
Free Thyroxine Index: 4.3 — ABNORMAL HIGH (ref 1.4–3.8)
T3 Uptake: 30 % (ref 22–35)
T4, Total: 14.2 ug/dL — ABNORMAL HIGH (ref 5.1–11.9)
TSH: 0.49 mIU/L (ref 0.40–4.50)

## 2021-12-17 ENCOUNTER — Other Ambulatory Visit: Payer: Self-pay | Admitting: *Deleted

## 2021-12-17 DIAGNOSIS — H35351 Cystoid macular degeneration, right eye: Secondary | ICD-10-CM | POA: Diagnosis not present

## 2021-12-17 DIAGNOSIS — I10 Essential (primary) hypertension: Secondary | ICD-10-CM

## 2021-12-17 DIAGNOSIS — H2511 Age-related nuclear cataract, right eye: Secondary | ICD-10-CM | POA: Diagnosis not present

## 2021-12-17 DIAGNOSIS — H2512 Age-related nuclear cataract, left eye: Secondary | ICD-10-CM | POA: Diagnosis not present

## 2021-12-17 DIAGNOSIS — Z01818 Encounter for other preprocedural examination: Secondary | ICD-10-CM | POA: Diagnosis not present

## 2021-12-17 MED ORDER — ENALAPRIL MALEATE 20 MG PO TABS: ORAL_TABLET | ORAL | 1 refills | Status: DC

## 2021-12-17 MED ORDER — AMLODIPINE BESYLATE 10 MG PO TABS: ORAL_TABLET | ORAL | 1 refills | Status: DC

## 2021-12-17 MED ORDER — METOPROLOL SUCCINATE ER 50 MG PO TB24: 50.0000 mg | ORAL_TABLET | Freq: Every day | ORAL | 1 refills | Status: DC

## 2022-01-02 ENCOUNTER — Other Ambulatory Visit: Payer: Self-pay | Admitting: Emergency Medicine

## 2022-01-15 DIAGNOSIS — H269 Unspecified cataract: Secondary | ICD-10-CM | POA: Diagnosis not present

## 2022-01-15 DIAGNOSIS — H2511 Age-related nuclear cataract, right eye: Secondary | ICD-10-CM | POA: Diagnosis not present

## 2022-01-29 DIAGNOSIS — H2512 Age-related nuclear cataract, left eye: Secondary | ICD-10-CM | POA: Diagnosis not present

## 2022-01-29 DIAGNOSIS — H35351 Cystoid macular degeneration, right eye: Secondary | ICD-10-CM | POA: Diagnosis not present

## 2022-01-29 DIAGNOSIS — H269 Unspecified cataract: Secondary | ICD-10-CM | POA: Diagnosis not present

## 2022-02-05 DIAGNOSIS — H35351 Cystoid macular degeneration, right eye: Secondary | ICD-10-CM | POA: Diagnosis not present

## 2022-02-05 DIAGNOSIS — H2512 Age-related nuclear cataract, left eye: Secondary | ICD-10-CM | POA: Diagnosis not present

## 2022-02-12 DIAGNOSIS — H43823 Vitreomacular adhesion, bilateral: Secondary | ICD-10-CM | POA: Diagnosis not present

## 2022-02-12 DIAGNOSIS — H33101 Unspecified retinoschisis, right eye: Secondary | ICD-10-CM | POA: Diagnosis not present

## 2022-02-12 DIAGNOSIS — H35351 Cystoid macular degeneration, right eye: Secondary | ICD-10-CM | POA: Diagnosis not present

## 2022-03-12 DIAGNOSIS — N3090 Cystitis, unspecified without hematuria: Secondary | ICD-10-CM | POA: Diagnosis not present

## 2022-03-12 DIAGNOSIS — R42 Dizziness and giddiness: Secondary | ICD-10-CM | POA: Diagnosis not present

## 2022-03-12 DIAGNOSIS — J029 Acute pharyngitis, unspecified: Secondary | ICD-10-CM | POA: Diagnosis not present

## 2022-03-12 DIAGNOSIS — N3000 Acute cystitis without hematuria: Secondary | ICD-10-CM | POA: Diagnosis not present

## 2022-03-12 DIAGNOSIS — R001 Bradycardia, unspecified: Secondary | ICD-10-CM | POA: Diagnosis not present

## 2022-03-12 DIAGNOSIS — R5383 Other fatigue: Secondary | ICD-10-CM | POA: Diagnosis not present

## 2022-03-12 DIAGNOSIS — R531 Weakness: Secondary | ICD-10-CM | POA: Diagnosis not present

## 2022-03-16 DIAGNOSIS — R002 Palpitations: Secondary | ICD-10-CM | POA: Diagnosis not present

## 2022-03-16 DIAGNOSIS — R531 Weakness: Secondary | ICD-10-CM | POA: Diagnosis not present

## 2022-03-16 DIAGNOSIS — R001 Bradycardia, unspecified: Secondary | ICD-10-CM | POA: Diagnosis not present

## 2022-03-16 DIAGNOSIS — R5383 Other fatigue: Secondary | ICD-10-CM | POA: Diagnosis not present

## 2022-03-25 ENCOUNTER — Encounter: Payer: Self-pay | Admitting: Emergency Medicine

## 2022-03-25 ENCOUNTER — Ambulatory Visit (INDEPENDENT_AMBULATORY_CARE_PROVIDER_SITE_OTHER): Payer: Medicare HMO | Admitting: Emergency Medicine

## 2022-03-25 VITALS — BP 108/62 | HR 52 | Temp 97.5°F | Ht <= 58 in | Wt 128.1 lb

## 2022-03-25 DIAGNOSIS — Z87440 Personal history of urinary (tract) infections: Secondary | ICD-10-CM | POA: Diagnosis not present

## 2022-03-25 DIAGNOSIS — I10 Essential (primary) hypertension: Secondary | ICD-10-CM

## 2022-03-25 DIAGNOSIS — Z23 Encounter for immunization: Secondary | ICD-10-CM

## 2022-03-25 DIAGNOSIS — E039 Hypothyroidism, unspecified: Secondary | ICD-10-CM | POA: Diagnosis not present

## 2022-03-25 DIAGNOSIS — H524 Presbyopia: Secondary | ICD-10-CM | POA: Diagnosis not present

## 2022-03-25 DIAGNOSIS — E785 Hyperlipidemia, unspecified: Secondary | ICD-10-CM | POA: Diagnosis not present

## 2022-03-25 DIAGNOSIS — H52223 Regular astigmatism, bilateral: Secondary | ICD-10-CM | POA: Diagnosis not present

## 2022-03-25 LAB — LIPID PANEL
Cholesterol: 160 mg/dL (ref 0–200)
HDL: 70.3 mg/dL (ref >39.00)
LDL Cholesterol: 77 mg/dL (ref 0–99)
NonHDL: 89.84
Total CHOL/HDL Ratio: 2
Triglycerides: 62 mg/dL (ref 0.0–149.0)
VLDL: 12.4 mg/dL (ref 0.0–40.0)

## 2022-03-25 MED ORDER — METOPROLOL SUCCINATE ER 25 MG PO TB24: 25.0000 mg | ORAL_TABLET | Freq: Every day | ORAL | 3 refills | Status: DC

## 2022-03-25 MED ORDER — LEVOTHYROXINE SODIUM 50 MCG PO TABS: 50.0000 ug | ORAL_TABLET | Freq: Every day | ORAL | 3 refills | Status: DC

## 2022-03-25 MED ORDER — AMLODIPINE BESYLATE 5 MG PO TABS: 5.0000 mg | ORAL_TABLET | Freq: Every day | ORAL | 3 refills | Status: DC

## 2022-03-25 NOTE — Assessment & Plan Note (Signed)
Asymptomatic. Advised to finish cephalexin as prescribed. Urine culture sent today.

## 2022-03-25 NOTE — Assessment & Plan Note (Signed)
Thyroid panel repeated today. Recommend to decrease dose of Synthroid to 50 mcg daily.

## 2022-03-25 NOTE — Assessment & Plan Note (Signed)
Well-controlled hypertension.  Low normal readings. BP Readings from Last 3 Encounters:  03/25/22 108/62  11/26/21 132/60  02/01/19 (!) 146/50  Recommend to decrease amlodipine to 5 mg daily, decrease metoprolol succinate to 25 mg daily and continue enalapril 20 mg daily Cardiovascular risk associated with hypertension discussed. Diet and nutrition discussed. Follow-up in 3 months.

## 2022-03-25 NOTE — Progress Notes (Signed)
Brenda Callahan 76 y.o.   Chief Complaint  Patient presents with   Follow-up    75mth f/u appt , patient was seen in Urgent Care , dizziness     HISTORY OF PRESENT ILLNESS: This is a 76y.o. female here for 346-monthollow-up of chronic medical problems. Recent seen in the emergency department for dizziness and UTI.  Started on cephalexin.  Dizziness is gone.  Much better today. History of hypertension.  Was advised to decrease the amount of amlodipine and metoprolol succinate to half. Last thyroid panel was abnormal.  Was advised to stop Synthroid.  Restarted it about 2 months ago. No other complaints or medical concerns today.  HPI   Prior to Admission medications   Medication Sig Start Date End Date Taking? Authorizing Provider  amLODipine (NORVASC) 5 MG tablet Take 1 tablet (5 mg total) by mouth daily. 03/25/22  Yes Deloy Archey, Ines BloomerMD  aspirin EC 81 MG tablet Take 81 mg by mouth daily.   Yes [provider]  atorvastatin (LIPITOR) 40 MG tablet TOME UNA TABLETA POR VIA ORAL TODOS LOS DIAS 01/02/22  Yes Leann Mayweather, MiInes BloomerMD  cephALEXin (KEFLEX) 500 MG capsule Take 500 mg by mouth 2 (two) times daily. 03/15/22  Yes [provider]  donepezil (ARICEPT) 10 MG tablet Take 10 mg by mouth at bedtime.   Yes [provider]  enalapril (VASOTEC) 20 MG tablet TOME UNA TABLETA TODOS LOS DIAS 12/17/21  Yes Jeronda Don, MiInes BloomerMD  levothyroxine (SYNTHROID) 50 MCG tablet Take 1 tablet (50 mcg total) by mouth daily. 03/25/22  Yes Carollyn Etcheverry, MiInes BloomerMD  meclizine (ANTIVERT) 12.5 MG tablet Take 12.5 mg by mouth 3 (three) times daily as needed. 03/15/22  Yes [provider]  metoprolol succinate (TOPROL-XL) 25 MG 24 hr tablet Take 1 tablet (25 mg total) by mouth daily. 03/25/22  Yes Tija Biss, MiInes BloomerMD  Misc. Devices KIT One pair of below the knee compression stockings 10/21/17  Yes Pollina, ChGwenyth AllegraMD  mometasone (NASONEX) 50 MCG/ACT nasal  spray Place 2 sprays into the nose daily. 10/21/17  Yes Pollina, ChGwenyth AllegraMD  pantoprazole (PROTONIX) 40 MG tablet TOME UNA TABLETA TODOS LOS DIAS 06/05/20  Yes JoLadell PierMD    No Known Allergies  Patient Active Problem List   Diagnosis Date Noted   Essential hypertension 02/01/2019   Hyperlipidemia 02/01/2019   Acquired hypothyroidism 02/01/2019   Osteopenia 02/01/2019    Past Medical History:  Diagnosis Date   Cataract    Typhoid     Past Surgical History:  Procedure Laterality Date   CESAREAN SECTION      Social History   Socioeconomic History   Marital status: Married    Spouse name: Not on file   Number of children: 2   Years of education: Not on file   Highest education level: 3rd grade  Occupational History   Occupation: retired  Tobacco Use   Smoking status: Never   Smokeless tobacco: Never  Vaping Use   Vaping Use: Never used  Substance and Sexual Activity   Alcohol use: Not Currently   Drug use: Never   Sexual activity: Not Currently  Other Topics Concern   Not on file  Social History Narrative   Not on file   Social Determinants of Health   Financial Resource Strain: Not on file  Food Insecurity: Not on file  Transportation Needs: Not on file  Physical Activity: Not on file  Stress:  Not on file  Social Connections: Not on file  Intimate Partner Violence: Not on file    Family History  Problem Relation Age of Onset   Hypertension Sister      Review of Systems  Constitutional: Negative.  Negative for chills and fever.  HENT: Negative.  Negative for congestion and sore throat.   Respiratory: Negative.  Negative for cough and shortness of breath.   Cardiovascular: Negative.  Negative for chest pain and palpitations.  Gastrointestinal: Negative.  Negative for abdominal pain, nausea and vomiting.  Genitourinary: Negative.   Skin: Negative.  Negative for rash.  Neurological: Negative.  Negative for dizziness and headaches.   All other systems reviewed and are negative.   Today's Vitals   03/25/22 0921  BP: 108/62  Pulse: (!) 52  Temp: (!) 97.5 F (36.4 C)  TempSrc: Oral  SpO2: 94%  Weight: 128 lb 2 oz (58.1 kg)  Height: 4' 10" (1.473 m)   Body mass index is 26.78 kg/m.  Physical Exam Vitals reviewed.  Constitutional:      Appearance: Normal appearance.  HENT:     Head: Normocephalic.     Mouth/Throat:     Mouth: Mucous membranes are moist.     Pharynx: Oropharynx is clear.  Eyes:     Extraocular Movements: Extraocular movements intact.     Conjunctiva/sclera: Conjunctivae normal.     Pupils: Pupils are equal, round, and reactive to light.  Cardiovascular:     Rate and Rhythm: Normal rate and regular rhythm.     Pulses: Normal pulses.     Heart sounds: Normal heart sounds.  Pulmonary:     Effort: Pulmonary effort is normal.     Breath sounds: Normal breath sounds.  Abdominal:     Palpations: Abdomen is soft.     Tenderness: There is no abdominal tenderness.  Musculoskeletal:     Cervical back: No tenderness.     Right lower leg: No edema.     Left lower leg: No edema.  Lymphadenopathy:     Cervical: No cervical adenopathy.  Skin:    General: Skin is warm and dry.  Neurological:     General: No focal deficit present.     Mental Status: She is alert and oriented to person, place, and time.  Psychiatric:        Mood and Affect: Mood normal.        Behavior: Behavior normal.      ASSESSMENT & PLAN: A total of 43 minutes was spent with the patient and counseling/coordination of care regarding preparing for this visit, review of most recent office visit notes, review of most recent blood work results, review of multiple chronic medical conditions and their management, review of all medications and changes made, need for blood work today, education on nutrition, prognosis, documentation, and need for follow-up  Problem List Items Addressed This Visit       Cardiovascular and  Mediastinum   Essential hypertension - Primary    Well-controlled hypertension.  Low normal readings. BP Readings from Last 3 Encounters:  03/25/22 108/62  11/26/21 132/60  02/01/19 (!) 146/50  Recommend to decrease amlodipine to 5 mg daily, decrease metoprolol succinate to 25 mg daily and continue enalapril 20 mg daily Cardiovascular risk associated with hypertension discussed. Diet and nutrition discussed. Follow-up in 3 months.       Relevant Medications   metoprolol succinate (TOPROL-XL) 25 MG 24 hr tablet   amLODipine (NORVASC) 5 MG tablet  Endocrine   Acquired hypothyroidism    Thyroid panel repeated today. Recommend to decrease dose of Synthroid to 50 mcg daily.      Relevant Medications   metoprolol succinate (TOPROL-XL) 25 MG 24 hr tablet   levothyroxine (SYNTHROID) 50 MCG tablet   Other Relevant Orders   Thyroid Panel With TSH     Other   Hyperlipidemia    Stable.  Diet and nutrition discussed. Continue atorvastatin 40 mg daily.      Relevant Medications   metoprolol succinate (TOPROL-XL) 25 MG 24 hr tablet   amLODipine (NORVASC) 5 MG tablet   Recent urinary tract infection    Asymptomatic. Advised to finish cephalexin as prescribed. Urine culture sent today.      Relevant Orders   Urine Culture   Other Visit Diagnoses     Need for vaccination       Relevant Orders   Flu Vaccine QUAD High Dose(Fluad) (Completed)   Dyslipidemia       Relevant Orders   Lipid panel      Patient Instructions  Mantenimiento de la salud despus de los 27 aos de edad Health Maintenance After Age 83 Despus de los 65 aos de edad, corre un riesgo mayor de Tourist information centre manager enfermedades e infecciones a Barrister's clerk, como tambin de sufrir lesiones por cadas. Las cadas son la causa principal de las fracturas de huesos y lesiones en la cabeza de personas mayores de 12 aos de edad. Recibir cuidados preventivos de forma regular puede ayudarlo a mantenerse saludable y en  buen [place]. Los cuidados preventivos incluyen realizarse anlisis de forma regular y Actor en el estilo de vida segn las recomendaciones del mdico. Converse con el mdico sobre lo siguiente: Las pruebas de deteccin y los anlisis que debe Dispensing optician. Una prueba de deteccin es un estudio que se para Hydrographic surveyor la presencia de una enfermedad cuando no tiene sntomas. Un plan de dieta y ejercicios adecuado para usted. Qu debo saber sobre las pruebas de deteccin y los anlisis para prevenir cadas? Realizarse pruebas de deteccin y [employer] es la mejor manera de Hydrographic surveyor un problema de salud de forma temprana. El diagnstico y tratamiento tempranos le brindan la mejor oportunidad de Chief Technology Officer las afecciones mdicas que son comunes despus de los 21 aos de edad. Ciertas afecciones y elecciones de estilo de vida pueden hacer que sea ms propenso a sufrir Engineer, manufacturing. El mdico puede recomendarle lo siguiente: Controles regulares de la visin. Una visin deficiente y afecciones como las cataratas pueden hacer que sea ms propenso a sufrir Engineer, manufacturing. Si [place] lentes, asegrese de obtener una receta actualizada si su visin cambia. Revisin de medicamentos. Revise regularmente con el mdico todos los medicamentos que toma, incluidos los medicamentos de [place]. Consulte al [employer] efectos secundarios que pueden hacer que sea ms propenso a sufrir Engineer, manufacturing. Informe al mdico si alguno de los medicamentos que toma lo hace sentir mareado o somnoliento. Controles de fuerza y equilibrio. El mdico puede recomendar ciertos estudios para controlar su fuerza y equilibrio al estar de pie, al caminar o al cambiar de posicin. Examen de los pies. El dolor y Chiropractor en los pies, como tambin no utilizar el calzado [place], pueden hacer que sea ms propenso a sufrir Engineer, manufacturing. Pruebas de deteccin, que incluyen las siguientes: Pruebas de deteccin para la osteoporosis. La osteoporosis es  una afeccin que hace que los huesos se tornen ms dbiles y se quiebren con ms facilidad. Pruebas de  deteccin para la presin arterial. Los cambios en la presin arterial y los medicamentos para Chief Technology Officer la presin arterial pueden hacerlo sentir mareado. Prueba de deteccin de la depresin. Es ms probable que sufra una cada si tiene miedo a caerse, se siente deprimido o se siente incapaz de Patent examiner. Prueba de deteccin de consumo de alcohol. Beber demasiado alcohol puede afectar su equilibrio y puede hacer que sea ms propenso a sufrir Engineer, manufacturing. Siga estas indicaciones en su casa: Estilo de vida No beba alcohol si: Su mdico le indica no hacerlo. Si bebe alcohol: Limite la cantidad que bebe a lo siguiente: De 0 a 1 medida por da para las mujeres. De 0 a 2 medidas por da para los hombres. Sepa cunta cantidad de alcohol hay en las bebidas que toma. En los [place], una medida equivale a una botella de cerveza de 12 oz (355 ml), un vaso de vino de 5 oz (148 ml) o un vaso de una bebida alcohlica de alta graduacin de 1 oz (44 ml). No consuma ningn producto que contenga nicotina o tabaco. Estos productos incluyen cigarrillos, tabaco para Higher education careers adviser y aparatos de vapeo, como los Psychologist, sport and exercise. Si necesita ayuda para dejar de consumir estos productos, consulte al [employer]. Actividad  Siga un programa de ejercicio regular para mantenerse en forma. Esto lo ayudar a Contractor equilibrio. Consulte al mdico qu tipos de ejercicios son adecuados para usted. Si necesita un bastn o un andador, selo segn las recomendaciones del mdico. Utilice calzado con buen apoyo y suela antideslizante. Seguridad  Retire los [place] puedan causar tropiezos tales como alfombras, cables u obstculos. Instale equipos de seguridad, como barras para sostn en los baos y barandas de seguridad en las escaleras. Walker habitaciones y los pasillos bien  iluminados. Indicaciones generales Hable con el mdico sobre sus riesgos de sufrir una cada. Infrmele a su mdico si: Se cae. Asegrese de informarle a su mdico acerca de todas las cadas, incluso aquellas que parecen ser [employer] Se siente mareado, cansado (tiene fatiga) o siente que pierde el equilibrio. Use los medicamentos de venta libre y los recetados solamente como se lo haya indicado el mdico. Estos incluyen suplementos. Siga una dieta sana y [place] un peso saludable. Una dieta saludable incluye productos lcteos descremados, carnes bajas en contenido de grasa ([place]), fibra de granos enteros, frijoles y [place] frutas y verduras. [place]. Realcese los estudios de rutina de la salud, dentales y de Public librarian. Resumen Tener un estilo de vida saludable y recibir cuidados preventivos pueden ayudar a Theatre stage manager salud y el bienestar despus de los 39 aos de [place]. Realizarse pruebas de deteccin y [employer] es la mejor manera de Hydrographic surveyor un problema de salud de forma temprana y Lourena Simmonds a Product/process development scientist una cada. El diagnstico y tratamiento tempranos le brindan la mejor oportunidad de Chief Technology Officer las afecciones mdicas ms comunes en las personas mayores de 41 aos de edad. Las cadas son la causa principal de las fracturas de huesos y lesiones en la cabeza de personas mayores de 55 aos de edad. Tome precauciones para evitar una cada en su casa. Trabaje con el mdico para saber qu cambios que puede hacer para mejorar su salud y [place], y [place]. Esta informacin no tiene Marine scientist el consejo del mdico. Asegrese de hacerle al mdico cualquier pregunta que tenga. Document Revised: 11/19/2020 Document Reviewed: 11/19/2020 Elsevier Patient Education  [place].  Agustina Caroli, MD [facility]

## 2022-03-25 NOTE — Patient Instructions (Signed)
Mantenimiento de la salud despus de los 65 aos de edad Health Maintenance After Age 78 Despus de los 65 aos de edad, corre un riesgo mayor de padecer ciertas enfermedades e infecciones a largo plazo, como tambin de sufrir lesiones por cadas. Las cadas son la causa principal de las fracturas de huesos y lesiones en la cabeza de personas mayores de 65 aos de edad. Recibir cuidados preventivos de forma regular puede ayudarlo a mantenerse saludable y en buen estado. Los cuidados preventivos incluyen realizarse anlisis de forma regular y realizar cambios en el estilo de vida segn las recomendaciones del mdico. Converse con el mdico sobre lo siguiente: Las pruebas de deteccin y los anlisis que debe realizarse. Una prueba de deteccin es un estudio que se para detectar la presencia de una enfermedad cuando no tiene sntomas. Un plan de dieta y ejercicios adecuado para usted. Qu debo saber sobre las pruebas de deteccin y los anlisis para prevenir cadas? Realizarse pruebas de deteccin y anlisis es la mejor manera de detectar un problema de salud de forma temprana. El diagnstico y tratamiento tempranos le brindan la mejor oportunidad de controlar las afecciones mdicas que son comunes despus de los 65 aos de edad. Ciertas afecciones y elecciones de estilo de vida pueden hacer que sea ms propenso a sufrir una cada. El mdico puede recomendarle lo siguiente: Controles regulares de la visin. Una visin deficiente y afecciones como las cataratas pueden hacer que sea ms propenso a sufrir una cada. Si usa lentes, asegrese de obtener una receta actualizada si su visin cambia. Revisin de medicamentos. Revise regularmente con el mdico todos los medicamentos que toma, incluidos los medicamentos de venta libre. Consulte al mdico sobre los efectos secundarios que pueden hacer que sea ms propenso a sufrir una cada. Informe al mdico si alguno de los medicamentos que toma lo hace sentir mareado o  somnoliento. Controles de fuerza y equilibrio. El mdico puede recomendar ciertos estudios para controlar su fuerza y equilibrio al estar de pie, al caminar o al cambiar de posicin. Examen de los pies. El dolor y el adormecimiento en los pies, como tambin no utilizar el calzado adecuado, pueden hacer que sea ms propenso a sufrir una cada. Pruebas de deteccin, que incluyen las siguientes: Pruebas de deteccin para la osteoporosis. La osteoporosis es una afeccin que hace que los huesos se tornen ms dbiles y se quiebren con ms facilidad. Pruebas de deteccin para la presin arterial. Los cambios en la presin arterial y los medicamentos para controlar la presin arterial pueden hacerlo sentir mareado. Prueba de deteccin de la depresin. Es ms probable que sufra una cada si tiene miedo a caerse, se siente deprimido o se siente incapaz de realizar actividades que sola hacer. Prueba de deteccin de consumo de alcohol. Beber demasiado alcohol puede afectar su equilibrio y puede hacer que sea ms propenso a sufrir una cada. Siga estas indicaciones en su casa: Estilo de vida No beba alcohol si: Su mdico le indica no hacerlo. Si bebe alcohol: Limite la cantidad que bebe a lo siguiente: De 0 a 1 medida por da para las mujeres. De 0 a 2 medidas por da para los hombres. Sepa cunta cantidad de alcohol hay en las bebidas que toma. En los [place], una medida equivale a una botella de cerveza de 12 oz (355 ml), un vaso de vino de 5 oz (148 ml) o un vaso de una bebida alcohlica de alta graduacin de 1 oz (44 ml). No consuma ningn producto que   contenga nicotina o tabaco. Estos productos incluyen cigarrillos, tabaco para mascar y aparatos de vapeo, como los cigarrillos electrnicos. Si necesita ayuda para dejar de consumir estos productos, consulte al mdico. Actividad  Siga un programa de ejercicio regular para mantenerse en forma. Esto lo ayudar a mantener el equilibrio. Consulte al  mdico qu tipos de ejercicios son adecuados para usted. Si necesita un bastn o un andador, selo segn las recomendaciones del mdico. Utilice calzado con buen apoyo y suela antideslizante. Seguridad  Retire los objetos que puedan causar tropiezos tales como alfombras, cables u obstculos. Instale equipos de seguridad, como barras para sostn en los baos y barandas de seguridad en las escaleras. Mantenga las habitaciones y los pasillos bien iluminados. Indicaciones generales Hable con el mdico sobre sus riesgos de sufrir una cada. Infrmele a su mdico si: Se cae. Asegrese de informarle a su mdico acerca de todas las cadas, incluso aquellas que parecen ser menores. Se siente mareado, cansado (tiene fatiga) o siente que pierde el equilibrio. Use los medicamentos de venta libre y los recetados solamente como se lo haya indicado el mdico. Estos incluyen suplementos. Siga una dieta sana y mantenga un peso saludable. Una dieta saludable incluye productos lcteos descremados, carnes bajas en contenido de grasa (magras), fibra de granos enteros, frijoles y muchas frutas y verduras. Mantngase al da con las vacunas. Realcese los estudios de rutina de la salud, dentales y de la vista. Resumen Tener un estilo de vida saludable y recibir cuidados preventivos pueden ayudar a promover la salud y el bienestar despus de los 65 aos de edad. Realizarse pruebas de deteccin y anlisis es la mejor manera de detectar un problema de salud de forma temprana y ayudarlo a evitar una cada. El diagnstico y tratamiento tempranos le brindan la mejor oportunidad de controlar las afecciones mdicas ms comunes en las personas mayores de 65 aos de edad. Las cadas son la causa principal de las fracturas de huesos y lesiones en la cabeza de personas mayores de 65 aos de edad. Tome precauciones para evitar una cada en su casa. Trabaje con el mdico para saber qu cambios que puede hacer para mejorar su salud y  bienestar, y para prevenir las cadas. Esta informacin no tiene como fin reemplazar el consejo del mdico. Asegrese de hacerle al mdico cualquier pregunta que tenga. Document Revised: 11/19/2020 Document Reviewed: 11/19/2020 Elsevier Patient Education  2023 Elsevier Inc.  

## 2022-03-25 NOTE — Assessment & Plan Note (Signed)
Stable.  Diet and nutrition discussed. Continue atorvastatin 40 mg daily. 

## 2022-03-26 LAB — THYROID PANEL WITH TSH
Free Thyroxine Index: 3.9 — ABNORMAL HIGH (ref 1.4–3.8)
T3 Uptake: 29 % (ref 22–35)
T4, Total: 13.3 ug/dL — ABNORMAL HIGH (ref 5.1–11.9)
TSH: 0.44 mIU/L (ref 0.40–4.50)

## 2022-03-26 LAB — URINE CULTURE

## 2022-03-29 NOTE — Progress Notes (Signed)
I lowered the dose from 75 to 50 mcg.

## 2022-04-02 DIAGNOSIS — H33101 Unspecified retinoschisis, right eye: Secondary | ICD-10-CM | POA: Diagnosis not present

## 2022-04-02 DIAGNOSIS — H43823 Vitreomacular adhesion, bilateral: Secondary | ICD-10-CM | POA: Diagnosis not present

## 2022-04-02 DIAGNOSIS — H35351 Cystoid macular degeneration, right eye: Secondary | ICD-10-CM | POA: Diagnosis not present

## 2022-05-27 ENCOUNTER — Telehealth: Payer: Self-pay | Admitting: Emergency Medicine

## 2022-05-27 DIAGNOSIS — I10 Essential (primary) hypertension: Secondary | ICD-10-CM

## 2022-05-27 MED ORDER — ENALAPRIL MALEATE 20 MG PO TABS: ORAL_TABLET | ORAL | 1 refills | Status: DC

## 2022-05-27 NOTE — Telephone Encounter (Signed)
Called [place] and informed him that the amlodipine does not need a new rx, patient will call pharmacy to request a refill  Enalapril rx sent to pharmacy

## 2022-05-27 NOTE — Telephone Encounter (Signed)
Thayer [place] called on behalf of patient, patient needs a new prescription for two medications, enalapril 20mg  and amlodipine 5mg . A good callback number is [phone].

## 2022-06-09 ENCOUNTER — Other Ambulatory Visit: Payer: Self-pay | Admitting: Emergency Medicine

## 2022-06-09 DIAGNOSIS — I10 Essential (primary) hypertension: Secondary | ICD-10-CM

## 2022-06-17 ENCOUNTER — Ambulatory Visit: Payer: Medicare HMO | Admitting: Emergency Medicine

## 2022-06-24 ENCOUNTER — Ambulatory Visit (INDEPENDENT_AMBULATORY_CARE_PROVIDER_SITE_OTHER): Payer: Medicare HMO | Admitting: Emergency Medicine

## 2022-06-24 ENCOUNTER — Encounter: Payer: Self-pay | Admitting: Emergency Medicine

## 2022-06-24 VITALS — BP 126/70 | HR 66 | Temp 98.4°F | Ht <= 58 in | Wt 129.2 lb

## 2022-06-24 DIAGNOSIS — N390 Urinary tract infection, site not specified: Secondary | ICD-10-CM | POA: Diagnosis not present

## 2022-06-24 DIAGNOSIS — E785 Hyperlipidemia, unspecified: Secondary | ICD-10-CM | POA: Diagnosis not present

## 2022-06-24 DIAGNOSIS — E039 Hypothyroidism, unspecified: Secondary | ICD-10-CM | POA: Diagnosis not present

## 2022-06-24 DIAGNOSIS — I10 Essential (primary) hypertension: Secondary | ICD-10-CM

## 2022-06-24 LAB — LIPID PANEL
Cholesterol: 222 mg/dL — ABNORMAL HIGH (ref 0–200)
HDL: 69.8 mg/dL
LDL Cholesterol: 135 mg/dL — ABNORMAL HIGH (ref 0–99)
NonHDL: 152.23
Total CHOL/HDL Ratio: 3
Triglycerides: 84 mg/dL (ref 0.0–149.0)
VLDL: 16.8 mg/dL (ref 0.0–40.0)

## 2022-06-24 LAB — COMPREHENSIVE METABOLIC PANEL WITH GFR
ALT: 8 U/L (ref 0–35)
AST: 18 U/L (ref 0–37)
Albumin: 3.9 g/dL (ref 3.5–5.2)
Alkaline Phosphatase: 76 U/L (ref 39–117)
BUN: 13 mg/dL (ref 6–23)
CO2: 27 meq/L (ref 19–32)
Calcium: 9.6 mg/dL (ref 8.4–10.5)
Chloride: 100 meq/L (ref 96–112)
Creatinine, Ser: 0.9 mg/dL (ref 0.40–1.20)
GFR: 61.23 mL/min
Glucose, Bld: 89 mg/dL (ref 70–99)
Potassium: 4.7 meq/L (ref 3.5–5.1)
Sodium: 134 meq/L — ABNORMAL LOW (ref 135–145)
Total Bilirubin: 0.6 mg/dL (ref 0.2–1.2)
Total Protein: 7.7 g/dL (ref 6.0–8.3)

## 2022-06-24 LAB — URINALYSIS, ROUTINE W REFLEX MICROSCOPIC
Bilirubin Urine: NEGATIVE
Hgb urine dipstick: NEGATIVE
Ketones, ur: NEGATIVE
Nitrite: NEGATIVE
RBC / HPF: NONE SEEN (ref 0–?)
Specific Gravity, Urine: 1.01 (ref 1.000–1.030)
Total Protein, Urine: NEGATIVE
Urine Glucose: NEGATIVE
Urobilinogen, UA: 0.2 (ref 0.0–1.0)
pH: 6 (ref 5.0–8.0)

## 2022-06-24 NOTE — Assessment & Plan Note (Signed)
Has symptoms of chronic UTI. Urinalysis done and urine sent for culture May need antibiotics depending on results Needs urology evaluation Referral placed today.

## 2022-06-24 NOTE — Patient Instructions (Signed)
Mantenimiento de la salud despus de los 65 aos de edad Health Maintenance After Age 78 Despus de los 65 aos de edad, corre un riesgo mayor de padecer ciertas enfermedades e infecciones a largo plazo, como tambin de sufrir lesiones por cadas. Las cadas son la causa principal de las fracturas de huesos y lesiones en la cabeza de personas mayores de 65 aos de edad. Recibir cuidados preventivos de forma regular puede ayudarlo a mantenerse saludable y en buen estado. Los cuidados preventivos incluyen realizarse anlisis de forma regular y realizar cambios en el estilo de vida segn las recomendaciones del mdico. Converse con el mdico sobre lo siguiente: Las pruebas de deteccin y los anlisis que debe realizarse. Una prueba de deteccin es un estudio que se para detectar la presencia de una enfermedad cuando no tiene sntomas. Un plan de dieta y ejercicios adecuado para usted. Qu debo saber sobre las pruebas de deteccin y los anlisis para prevenir cadas? Realizarse pruebas de deteccin y anlisis es la mejor manera de detectar un problema de salud de forma temprana. El diagnstico y tratamiento tempranos le brindan la mejor oportunidad de controlar las afecciones mdicas que son comunes despus de los 65 aos de edad. Ciertas afecciones y elecciones de estilo de vida pueden hacer que sea ms propenso a sufrir una cada. El mdico puede recomendarle lo siguiente: Controles regulares de la visin. Una visin deficiente y afecciones como las cataratas pueden hacer que sea ms propenso a sufrir una cada. Si usa lentes, asegrese de obtener una receta actualizada si su visin cambia. Revisin de medicamentos. Revise regularmente con el mdico todos los medicamentos que toma, incluidos los medicamentos de venta libre. Consulte al mdico sobre los efectos secundarios que pueden hacer que sea ms propenso a sufrir una cada. Informe al mdico si alguno de los medicamentos que toma lo hace sentir mareado o  somnoliento. Controles de fuerza y equilibrio. El mdico puede recomendar ciertos estudios para controlar su fuerza y equilibrio al estar de pie, al caminar o al cambiar de posicin. Examen de los pies. El dolor y el adormecimiento en los pies, como tambin no utilizar el calzado adecuado, pueden hacer que sea ms propenso a sufrir una cada. Pruebas de deteccin, que incluyen las siguientes: Pruebas de deteccin para la osteoporosis. La osteoporosis es una afeccin que hace que los huesos se tornen ms dbiles y se quiebren con ms facilidad. Pruebas de deteccin para la presin arterial. Los cambios en la presin arterial y los medicamentos para controlar la presin arterial pueden hacerlo sentir mareado. Prueba de deteccin de la depresin. Es ms probable que sufra una cada si tiene miedo a caerse, se siente deprimido o se siente incapaz de realizar actividades que sola hacer. Prueba de deteccin de consumo de alcohol. Beber demasiado alcohol puede afectar su equilibrio y puede hacer que sea ms propenso a sufrir una cada. Siga estas indicaciones en su casa: Estilo de vida No beba alcohol si: Su mdico le indica no hacerlo. Si bebe alcohol: Limite la cantidad que bebe a lo siguiente: De 0 a 1 medida por da para las mujeres. De 0 a 2 medidas por da para los hombres. Sepa cunta cantidad de alcohol hay en las bebidas que toma. En los [place], una medida equivale a una botella de cerveza de 12 oz (355 ml), un vaso de vino de 5 oz (148 ml) o un vaso de una bebida alcohlica de alta graduacin de 1 oz (44 ml). No consuma ningn producto que   contenga nicotina o tabaco. Estos productos incluyen cigarrillos, tabaco para mascar y aparatos de vapeo, como los cigarrillos electrnicos. Si necesita ayuda para dejar de consumir estos productos, consulte al mdico. Actividad  Siga un programa de ejercicio regular para mantenerse en forma. Esto lo ayudar a mantener el equilibrio. Consulte al  mdico qu tipos de ejercicios son adecuados para usted. Si necesita un bastn o un andador, selo segn las recomendaciones del mdico. Utilice calzado con buen apoyo y suela antideslizante. Seguridad  Retire los objetos que puedan causar tropiezos tales como alfombras, cables u obstculos. Instale equipos de seguridad, como barras para sostn en los baos y barandas de seguridad en las escaleras. Mantenga las habitaciones y los pasillos bien iluminados. Indicaciones generales Hable con el mdico sobre sus riesgos de sufrir una cada. Infrmele a su mdico si: Se cae. Asegrese de informarle a su mdico acerca de todas las cadas, incluso aquellas que parecen ser menores. Se siente mareado, cansado (tiene fatiga) o siente que pierde el equilibrio. Use los medicamentos de venta libre y los recetados solamente como se lo haya indicado el mdico. Estos incluyen suplementos. Siga una dieta sana y mantenga un peso saludable. Una dieta saludable incluye productos lcteos descremados, carnes bajas en contenido de grasa (magras), fibra de granos enteros, frijoles y muchas frutas y verduras. Mantngase al da con las vacunas. Realcese los estudios de rutina de la salud, dentales y de la vista. Resumen Tener un estilo de vida saludable y recibir cuidados preventivos pueden ayudar a promover la salud y el bienestar despus de los 65 aos de edad. Realizarse pruebas de deteccin y anlisis es la mejor manera de detectar un problema de salud de forma temprana y ayudarlo a evitar una cada. El diagnstico y tratamiento tempranos le brindan la mejor oportunidad de controlar las afecciones mdicas ms comunes en las personas mayores de 65 aos de edad. Las cadas son la causa principal de las fracturas de huesos y lesiones en la cabeza de personas mayores de 65 aos de edad. Tome precauciones para evitar una cada en su casa. Trabaje con el mdico para saber qu cambios que puede hacer para mejorar su salud y  bienestar, y para prevenir las cadas. Esta informacin no tiene como fin reemplazar el consejo del mdico. Asegrese de hacerle al mdico cualquier pregunta que tenga. Document Revised: 11/19/2020 Document Reviewed: 11/19/2020 Elsevier Patient Education  2023 Elsevier Inc.  

## 2022-06-24 NOTE — Assessment & Plan Note (Signed)
Clinically euthyroid.  TSH done today. Continue levothyroxine 50 mcg daily. 

## 2022-06-24 NOTE — Progress Notes (Signed)
Brenda Callahan 76 y.o.   Chief Complaint  Patient presents with   Follow-up    3 month f/u    HISTORY OF PRESENT ILLNESS: This is a 76 y.o. female 2-monthfollow-up of hypertension, hypothyroidism, and dyslipidemia. Has history of recurrent UTIs.  Complaining of intermittent lower abdominal pressure and occasional burning on urination No other associated symptoms No other complaints or medical concerns today.  HPI   Prior to Admission medications   Medication Sig Start Date End Date Taking? Authorizing Provider  amLODipine (NORVASC) 5 MG tablet Take 1 tablet (5 mg total) by mouth daily. 03/25/22  Yes Ernestine Rohman,Ines Bloomer MD  aspirin EC 81 MG tablet Take 81 mg by mouth daily.   Yes [provider]  atorvastatin (LIPITOR) 40 MG tablet TOME UNA TABLETA POR VIA ORAL TODOS LOS DIAS 01/02/22  Yes Fabian Coca, MInes Bloomer MD  donepezil (ARICEPT) 10 MG tablet Take 10 mg by mouth at bedtime.   Yes [provider]  enalapril (VASOTEC) 20 MG tablet TOME UNA TABLETA TODOS LOS DIAS 05/27/22  Yes Kamoria Lucien, MInes Bloomer MD  levothyroxine (SYNTHROID) 50 MCG tablet Take 1 tablet (50 mcg total) by mouth daily. 03/25/22  Yes Nazarene Bunning, MInes Bloomer MD  meclizine (ANTIVERT) 12.5 MG tablet Take 12.5 mg by mouth 3 (three) times daily as needed. 03/15/22  Yes [provider]  metoprolol succinate (TOPROL-XL) 25 MG 24 hr tablet Take 1 tablet (25 mg total) by mouth daily. 03/25/22  Yes Kaisyn Reinhold, MInes Bloomer MD  Misc. Devices KIT One pair of below the knee compression stockings 10/21/17  Yes Pollina, CGwenyth Allegra MD  mometasone (NASONEX) 50 MCG/ACT nasal spray Place 2 sprays into the nose daily. 10/21/17  Yes Pollina, CGwenyth Allegra MD  pantoprazole (PROTONIX) 40 MG tablet TOME UNA TABLETA TODOS LOS DIAS 06/05/20  Yes JLadell Pier MD    No Known Allergies  Patient Active Problem List   Diagnosis Date Noted   Essential hypertension 02/01/2019   Hyperlipidemia 02/01/2019    Acquired hypothyroidism 02/01/2019   Osteopenia 02/01/2019    Past Medical History:  Diagnosis Date   Cataract    Typhoid     Past Surgical History:  Procedure Laterality Date   CESAREAN SECTION      Social History   Socioeconomic History   Marital status: Married    Spouse name: Not on file   Number of children: 2   Years of education: Not on file   Highest education level: 3rd grade  Occupational History   Occupation: retired  Tobacco Use   Smoking status: Never   Smokeless tobacco: Never  Vaping Use   Vaping Use: Never used  Substance and Sexual Activity   Alcohol use: Not Currently   Drug use: Never   Sexual activity: Not Currently  Other Topics Concern   Not on file  Social History Narrative   Not on file   Social Determinants of Health   Financial Resource Strain: Not on file  Food Insecurity: Not on file  Transportation Needs: Not on file  Physical Activity: Not on file  Stress: Not on file  Social Connections: Not on file  Intimate Partner Violence: Not on file    Family History  Problem Relation Age of Onset   Hypertension Sister      Review of Systems  Constitutional: Negative.  Negative for chills and fever.  HENT: Negative.  Negative for congestion and sore throat.   Respiratory: Negative.  Negative for cough and shortness  of breath.   Cardiovascular: Negative.  Negative for chest pain and palpitations.  Gastrointestinal:  Negative for abdominal pain, diarrhea, nausea and vomiting.  Genitourinary: Negative.   Skin: Negative.  Negative for rash.  Neurological: Negative.  Negative for dizziness and headaches.  All other systems reviewed and are negative.  Today's Vitals   06/24/22 1317  Weight: 129 lb 3.2 oz (58.6 kg)   Body mass index is 27 kg/m.   Physical Exam Vitals reviewed.  Constitutional:      Appearance: Normal appearance.  HENT:     Head: Normocephalic.  Eyes:     Extraocular Movements: Extraocular movements intact.      Pupils: Pupils are equal, round, and reactive to light.  Cardiovascular:     Rate and Rhythm: Normal rate and regular rhythm.     Pulses: Normal pulses.     Heart sounds: Normal heart sounds.  Pulmonary:     Effort: Pulmonary effort is normal.     Breath sounds: Normal breath sounds.  Skin:    General: Skin is warm and dry.  Neurological:     Mental Status: She is alert and oriented to person, place, and time.  Psychiatric:        Mood and Affect: Mood normal.        Behavior: Behavior normal.      ASSESSMENT & PLAN: A total of 45 minutes was spent with the patient and counseling/coordination of care regarding preparing for this visit, review of most recent office visit notes, review of most recent blood work results, review of multiple chronic medical conditions and their management, review of all medications, need for urology evaluation given history of recurrent UTIs, education on nutrition, prognosis, documentation, need for blood work today, and need for follow-up.  Problem List Items Addressed This Visit       Cardiovascular and Mediastinum   Essential hypertension - Primary    Well-controlled hypertension. Continue metoprolol succinate 25 mg daily, enalapril 20 mg daily, and amlodipine 5 mg daily. Cardiovascular risks associated with hypertension discussed. BP Readings from Last 3 Encounters:  06/24/22 126/70  03/25/22 108/62  11/26/21 132/60        Relevant Orders   Comprehensive metabolic panel     Endocrine   Acquired hypothyroidism    Clinically euthyroid.  TSH done today. Continue levothyroxine 50 mcg daily.      Relevant Orders   TSH     Genitourinary   Recurrent UTI    Has symptoms of chronic UTI. Urinalysis done and urine sent for culture May need antibiotics depending on results Needs urology evaluation Referral placed today.      Relevant Orders   Urinalysis   Urine Culture   Ambulatory referral to Urology     Other    Hyperlipidemia    Taking atorvastatin 40 mg only when she remembers. Diet and nutrition discussed.  Lipid profile done today.      Patient Instructions  Mantenimiento de la salud despus de los 70 aos de edad Health Maintenance After Age 17 Despus de los 65 aos de edad, corre un riesgo mayor de Tourist information centre manager enfermedades e infecciones a Barrister's clerk, como tambin de sufrir lesiones por cadas. Las cadas son la causa principal de las fracturas de huesos y lesiones en la cabeza de personas mayores de 33 aos de edad. Recibir cuidados preventivos de forma regular puede ayudarlo a mantenerse saludable y en buen [place]. Los cuidados preventivos incluyen realizarse anlisis de forma regular y Optometrist  cambios en el estilo de vida segn las recomendaciones del mdico. Converse con el mdico sobre lo siguiente: Las pruebas de deteccin y los anlisis que debe Dispensing optician. Una prueba de deteccin es un estudio que se para Hydrographic surveyor la presencia de una enfermedad cuando no tiene sntomas. Un plan de dieta y ejercicios adecuado para usted. Qu debo saber sobre las pruebas de deteccin y los anlisis para prevenir cadas? Realizarse pruebas de deteccin y [employer] es la mejor manera de Hydrographic surveyor un problema de salud de forma temprana. El diagnstico y tratamiento tempranos le brindan la mejor oportunidad de Chief Technology Officer las afecciones mdicas que son comunes despus de los 51 aos de edad. Ciertas afecciones y elecciones de estilo de vida pueden hacer que sea ms propenso a sufrir Engineer, manufacturing. El mdico puede recomendarle lo siguiente: Controles regulares de la visin. Una visin deficiente y afecciones como las cataratas pueden hacer que sea ms propenso a sufrir Engineer, manufacturing. Si [place] lentes, asegrese de obtener una receta actualizada si su visin cambia. Revisin de medicamentos. Revise regularmente con el mdico todos los medicamentos que toma, incluidos los medicamentos de [place]. Consulte al [employer]  efectos secundarios que pueden hacer que sea ms propenso a sufrir Engineer, manufacturing. Informe al mdico si alguno de los medicamentos que toma lo hace sentir mareado o somnoliento. Controles de fuerza y equilibrio. El mdico puede recomendar ciertos estudios para controlar su fuerza y equilibrio al estar de pie, al caminar o al cambiar de posicin. Examen de los pies. El dolor y Chiropractor en los pies, como tambin no utilizar el calzado [place], pueden hacer que sea ms propenso a sufrir Engineer, manufacturing. Pruebas de deteccin, que incluyen las siguientes: Pruebas de deteccin para la osteoporosis. La osteoporosis es una afeccin que hace que los huesos se tornen ms dbiles y se quiebren con ms facilidad. Pruebas de deteccin para la presin arterial. Los cambios en la presin arterial y los medicamentos para Chief Technology Officer la presin arterial pueden hacerlo sentir mareado. Prueba de deteccin de la depresin. Es ms probable que sufra una cada si tiene miedo a caerse, se siente deprimido o se siente incapaz de Patent examiner. Prueba de deteccin de consumo de alcohol. Beber demasiado alcohol puede afectar su equilibrio y puede hacer que sea ms propenso a sufrir Engineer, manufacturing. Siga estas indicaciones en su casa: Estilo de vida No beba alcohol si: Su mdico le indica no hacerlo. Si bebe alcohol: Limite la cantidad que bebe a lo siguiente: De 0 a 1 medida por da para las mujeres. De 0 a 2 medidas por da para los hombres. Sepa cunta cantidad de alcohol hay en las bebidas que toma. En los [place], una medida equivale a una botella de cerveza de 12 oz (355 ml), un vaso de vino de 5 oz (148 ml) o un vaso de una bebida alcohlica de alta graduacin de 1 oz (44 ml). No consuma ningn producto que contenga nicotina o tabaco. Estos productos incluyen cigarrillos, tabaco para Higher education careers adviser y aparatos de vapeo, como los Psychologist, sport and exercise. Si necesita ayuda para dejar de consumir estos  productos, consulte al [employer]. Actividad  Siga un programa de ejercicio regular para mantenerse en forma. Esto lo ayudar a Contractor equilibrio. Consulte al mdico qu tipos de ejercicios son adecuados para usted. Si necesita un bastn o un andador, selo segn las recomendaciones del mdico. Utilice calzado con buen apoyo y suela antideslizante. Seguridad  Retire los [place] puedan causar tropiezos tales  como alfombras, cables u obstculos. Instale equipos de seguridad, como barras para sostn en los baos y barandas de seguridad en las escaleras. [place] habitaciones y los pasillos bien iluminados. Indicaciones generales Hable con el mdico sobre sus riesgos de sufrir una cada. Infrmele a su mdico si: Se cae. Asegrese de informarle a su mdico acerca de todas las cadas, incluso aquellas que parecen ser [employer] Se siente mareado, cansado (tiene fatiga) o siente que pierde el equilibrio. Use los medicamentos de venta libre y los recetados solamente como se lo haya indicado el mdico. Estos incluyen suplementos. Siga una dieta sana y [place] un peso saludable. Una dieta saludable incluye productos lcteos descremados, carnes bajas en contenido de grasa ([place]), fibra de granos enteros, frijoles y Adeline frutas y verduras. [place]. Realcese los estudios de rutina de la salud, dentales y de Public librarian. Resumen Tener un estilo de vida saludable y recibir cuidados preventivos pueden ayudar a Theatre stage manager salud y el bienestar despus de los 52 aos de [place]. Realizarse pruebas de deteccin y [employer] es la mejor manera de Hydrographic surveyor un problema de salud de forma temprana y Lourena Simmonds a Product/process development scientist una cada. El diagnstico y tratamiento tempranos le brindan la mejor oportunidad de Chief Technology Officer las afecciones mdicas ms comunes en las personas mayores de 71 aos de edad. Las cadas son la causa principal de las fracturas de huesos y lesiones en la cabeza de personas  mayores de 37 aos de edad. Tome precauciones para evitar una cada en su casa. Trabaje con el mdico para saber qu cambios que puede hacer para mejorar su salud y [place], y [place]. Esta informacin no tiene Marine scientist el consejo del mdico. Asegrese de hacerle al mdico cualquier pregunta que tenga. Document Revised: 11/19/2020 Document Reviewed: 11/19/2020 Elsevier Patient Education  [place], MD [facility]

## 2022-06-24 NOTE — Assessment & Plan Note (Signed)
Taking atorvastatin 40 mg only when she remembers. Diet and nutrition discussed.  Lipid profile done today.

## 2022-06-24 NOTE — Assessment & Plan Note (Signed)
Well-controlled hypertension. Continue metoprolol succinate 25 mg daily, enalapril 20 mg daily, and amlodipine 5 mg daily. Cardiovascular risks associated with hypertension discussed. BP Readings from Last 3 Encounters:  06/24/22 126/70  03/25/22 108/62  11/26/21 132/60

## 2022-06-25 ENCOUNTER — Telehealth: Payer: Self-pay | Admitting: Emergency Medicine

## 2022-06-25 LAB — TSH: TSH: 4.17 u[IU]/mL (ref 0.35–5.50)

## 2022-06-25 NOTE — Telephone Encounter (Signed)
Called patient son to inform him that patient rx was sent to the pharmacy back in Sept with a year supply. He will call pharmacy to request a refill

## 2022-06-25 NOTE — Telephone Encounter (Signed)
Caller & Relationship to patient: Self   Call back number: [phone]   Date of last office visit: 12.28.23  Date of next office visit: 6.27.23  Medication(s) to be refilled:  amLODipine (NORVASC) 10 MG tablet   levothyroxine (SYNTHROID) 50 MCG tablet   metoprolol succinate (TOPROL-XL) 25 MG 24 hr tablet   Preferred Pharmacy:  [id number]   Phone: [phone]  Fax: [phone]

## 2022-06-26 ENCOUNTER — Other Ambulatory Visit: Payer: Self-pay | Admitting: Emergency Medicine

## 2022-06-26 LAB — URINE CULTURE

## 2022-06-26 MED ORDER — CEFUROXIME AXETIL 500 MG PO TABS: 500.0000 mg | ORAL_TABLET | Freq: Two times a day (BID) | ORAL | 0 refills | Status: AC

## 2022-07-28 DIAGNOSIS — Z87440 Personal history of urinary (tract) infections: Secondary | ICD-10-CM | POA: Diagnosis not present

## 2022-07-28 DIAGNOSIS — I10 Essential (primary) hypertension: Secondary | ICD-10-CM | POA: Diagnosis not present

## 2022-07-28 DIAGNOSIS — R195 Other fecal abnormalities: Secondary | ICD-10-CM | POA: Diagnosis not present

## 2022-07-28 DIAGNOSIS — R001 Bradycardia, unspecified: Secondary | ICD-10-CM | POA: Diagnosis not present

## 2022-09-15 DIAGNOSIS — R300 Dysuria: Secondary | ICD-10-CM | POA: Diagnosis not present

## 2022-09-15 DIAGNOSIS — Z87440 Personal history of urinary (tract) infections: Secondary | ICD-10-CM | POA: Diagnosis not present

## 2022-09-15 DIAGNOSIS — N898 Other specified noninflammatory disorders of vagina: Secondary | ICD-10-CM | POA: Diagnosis not present

## 2022-09-24 DIAGNOSIS — H16143 Punctate keratitis, bilateral: Secondary | ICD-10-CM | POA: Diagnosis not present

## 2022-09-24 DIAGNOSIS — H04123 Dry eye syndrome of bilateral lacrimal glands: Secondary | ICD-10-CM | POA: Diagnosis not present

## 2022-10-05 DIAGNOSIS — H52223 Regular astigmatism, bilateral: Secondary | ICD-10-CM | POA: Diagnosis not present

## 2022-10-05 DIAGNOSIS — H524 Presbyopia: Secondary | ICD-10-CM | POA: Diagnosis not present

## 2022-10-06 DIAGNOSIS — F03A4 Unspecified dementia, mild, with anxiety: Secondary | ICD-10-CM | POA: Diagnosis not present

## 2022-10-21 DIAGNOSIS — N3090 Cystitis, unspecified without hematuria: Secondary | ICD-10-CM | POA: Diagnosis not present

## 2022-10-21 DIAGNOSIS — R001 Bradycardia, unspecified: Secondary | ICD-10-CM | POA: Diagnosis not present

## 2022-10-21 DIAGNOSIS — Z7982 Long term (current) use of aspirin: Secondary | ICD-10-CM | POA: Diagnosis not present

## 2022-10-21 DIAGNOSIS — Z5982 Transportation insecurity: Secondary | ICD-10-CM | POA: Diagnosis not present

## 2022-10-21 DIAGNOSIS — E785 Hyperlipidemia, unspecified: Secondary | ICD-10-CM | POA: Diagnosis not present

## 2022-10-21 DIAGNOSIS — Z79890 Hormone replacement therapy (postmenopausal): Secondary | ICD-10-CM | POA: Diagnosis not present

## 2022-10-21 DIAGNOSIS — N952 Postmenopausal atrophic vaginitis: Secondary | ICD-10-CM | POA: Diagnosis not present

## 2022-10-21 DIAGNOSIS — I129 Hypertensive chronic kidney disease with stage 1 through stage 4 chronic kidney disease, or unspecified chronic kidney disease: Secondary | ICD-10-CM | POA: Diagnosis not present

## 2022-10-21 DIAGNOSIS — N182 Chronic kidney disease, stage 2 (mild): Secondary | ICD-10-CM | POA: Diagnosis not present

## 2022-10-21 DIAGNOSIS — F0280 Dementia in other diseases classified elsewhere without behavioral disturbance: Secondary | ICD-10-CM | POA: Diagnosis not present

## 2022-10-21 DIAGNOSIS — E039 Hypothyroidism, unspecified: Secondary | ICD-10-CM | POA: Diagnosis not present

## 2022-11-02 ENCOUNTER — Telehealth: Payer: Self-pay | Admitting: Emergency Medicine

## 2022-11-02 NOTE — Telephone Encounter (Signed)
Contacted Tennis Must to schedule their annual wellness visit. Patient declined to schedule AWV at this time. Transferred care.  [facility] Care Guide [facility] AWV TEAM Direct Dial: [phone]

## 2022-11-30 DIAGNOSIS — Z1231 Encounter for screening mammogram for malignant neoplasm of breast: Secondary | ICD-10-CM | POA: Diagnosis not present

## 2022-12-07 DIAGNOSIS — Z9189 Other specified personal risk factors, not elsewhere classified: Secondary | ICD-10-CM | POA: Diagnosis not present

## 2022-12-07 DIAGNOSIS — J302 Other seasonal allergic rhinitis: Secondary | ICD-10-CM | POA: Diagnosis not present

## 2022-12-07 DIAGNOSIS — R21 Rash and other nonspecific skin eruption: Secondary | ICD-10-CM | POA: Diagnosis not present

## 2022-12-07 DIAGNOSIS — F03A4 Unspecified dementia, mild, with anxiety: Secondary | ICD-10-CM | POA: Diagnosis not present

## 2022-12-17 ENCOUNTER — Other Ambulatory Visit: Payer: Self-pay | Admitting: Emergency Medicine

## 2022-12-17 DIAGNOSIS — I10 Essential (primary) hypertension: Secondary | ICD-10-CM

## 2022-12-23 ENCOUNTER — Ambulatory Visit: Payer: Medicare HMO | Admitting: Emergency Medicine

## 2023-02-05 ENCOUNTER — Other Ambulatory Visit: Payer: Self-pay | Admitting: Emergency Medicine

## 2023-02-05 DIAGNOSIS — I10 Essential (primary) hypertension: Secondary | ICD-10-CM

## 2023-02-05 DIAGNOSIS — E039 Hypothyroidism, unspecified: Secondary | ICD-10-CM

## 2023-04-08 DIAGNOSIS — I2510 Atherosclerotic heart disease of native coronary artery without angina pectoris: Secondary | ICD-10-CM | POA: Diagnosis not present

## 2023-04-08 DIAGNOSIS — I10 Essential (primary) hypertension: Secondary | ICD-10-CM | POA: Diagnosis not present

## 2023-04-08 DIAGNOSIS — E785 Hyperlipidemia, unspecified: Secondary | ICD-10-CM | POA: Diagnosis not present

## 2023-04-08 DIAGNOSIS — E538 Deficiency of other specified B group vitamins: Secondary | ICD-10-CM | POA: Diagnosis not present

## 2023-04-08 DIAGNOSIS — I700 Atherosclerosis of aorta: Secondary | ICD-10-CM | POA: Diagnosis not present

## 2023-04-08 DIAGNOSIS — E039 Hypothyroidism, unspecified: Secondary | ICD-10-CM | POA: Diagnosis not present

## 2023-04-08 DIAGNOSIS — Z9189 Other specified personal risk factors, not elsewhere classified: Secondary | ICD-10-CM | POA: Diagnosis not present

## 2023-04-08 DIAGNOSIS — F03A4 Unspecified dementia, mild, with anxiety: Secondary | ICD-10-CM | POA: Diagnosis not present

## 2023-04-08 DIAGNOSIS — R21 Rash and other nonspecific skin eruption: Secondary | ICD-10-CM | POA: Diagnosis not present

## 2023-06-30 ENCOUNTER — Other Ambulatory Visit: Payer: Self-pay | Admitting: Emergency Medicine

## 2023-06-30 DIAGNOSIS — I10 Essential (primary) hypertension: Secondary | ICD-10-CM

## 2023-08-01 DIAGNOSIS — I700 Atherosclerosis of aorta: Secondary | ICD-10-CM | POA: Diagnosis not present

## 2023-08-01 DIAGNOSIS — E785 Hyperlipidemia, unspecified: Secondary | ICD-10-CM | POA: Diagnosis not present

## 2023-08-01 DIAGNOSIS — I110 Hypertensive heart disease with heart failure: Secondary | ICD-10-CM | POA: Diagnosis not present

## 2023-08-01 DIAGNOSIS — F03A4 Unspecified dementia, mild, with anxiety: Secondary | ICD-10-CM | POA: Diagnosis not present

## 2023-08-01 DIAGNOSIS — I2510 Atherosclerotic heart disease of native coronary artery without angina pectoris: Secondary | ICD-10-CM | POA: Diagnosis not present

## 2023-08-01 DIAGNOSIS — E039 Hypothyroidism, unspecified: Secondary | ICD-10-CM | POA: Diagnosis not present

## 2023-08-01 DIAGNOSIS — J302 Other seasonal allergic rhinitis: Secondary | ICD-10-CM | POA: Diagnosis not present

## 2023-08-01 DIAGNOSIS — I509 Heart failure, unspecified: Secondary | ICD-10-CM | POA: Diagnosis not present

## 2023-09-25 ENCOUNTER — Other Ambulatory Visit: Payer: Self-pay | Admitting: Emergency Medicine

## 2023-09-25 DIAGNOSIS — I10 Essential (primary) hypertension: Secondary | ICD-10-CM

## 2023-12-12 ENCOUNTER — Other Ambulatory Visit (HOSPITAL_BASED_OUTPATIENT_CLINIC_OR_DEPARTMENT_OTHER): Payer: Self-pay | Admitting: Family Medicine

## 2023-12-12 DIAGNOSIS — M8580 Other specified disorders of bone density and structure, unspecified site: Secondary | ICD-10-CM

## 2023-12-24 ENCOUNTER — Other Ambulatory Visit: Payer: Self-pay | Admitting: Emergency Medicine

## 2023-12-24 DIAGNOSIS — I10 Essential (primary) hypertension: Secondary | ICD-10-CM

## 2024-01-25 ENCOUNTER — Other Ambulatory Visit (HOSPITAL_BASED_OUTPATIENT_CLINIC_OR_DEPARTMENT_OTHER)

## 2024-01-25 ENCOUNTER — Ambulatory Visit (HOSPITAL_BASED_OUTPATIENT_CLINIC_OR_DEPARTMENT_OTHER)
Admission: RE | Admit: 2024-01-25 | Discharge: 2024-01-25 | Disposition: A | Discharge status: Home / Self Care | Source: Ambulatory Visit | Attending: Family Medicine | Admitting: Family Medicine

## 2024-01-25 DIAGNOSIS — Z780 Asymptomatic menopausal state: Secondary | ICD-10-CM | POA: Diagnosis present

## 2024-01-25 DIAGNOSIS — M8580 Other specified disorders of bone density and structure, unspecified site: Secondary | ICD-10-CM | POA: Diagnosis present

## 2024-03-19 ENCOUNTER — Other Ambulatory Visit: Payer: Self-pay | Admitting: Emergency Medicine

## 2024-03-19 DIAGNOSIS — I10 Essential (primary) hypertension: Secondary | ICD-10-CM
